# Patient Record
Sex: Female | Born: 1935 | Race: White | Hispanic: No | Smoking: Never smoker
Health system: Southern US, Community
[De-identification: ages and names within clinical notes are randomized; demographics above are authoritative.]

## PROBLEM LIST (undated history)

## (undated) DIAGNOSIS — M810 Age-related osteoporosis without current pathological fracture: Secondary | ICD-10-CM

## (undated) DIAGNOSIS — S065XAA Traumatic subdural hemorrhage with loss of consciousness status unknown, initial encounter: Secondary | ICD-10-CM

## (undated) DIAGNOSIS — I729 Aneurysm of unspecified site: Secondary | ICD-10-CM

## (undated) DIAGNOSIS — I509 Heart failure, unspecified: Secondary | ICD-10-CM

## (undated) DIAGNOSIS — S065X9A Traumatic subdural hemorrhage with loss of consciousness of unspecified duration, initial encounter: Secondary | ICD-10-CM

## (undated) DIAGNOSIS — I4891 Unspecified atrial fibrillation: Secondary | ICD-10-CM

## (undated) DIAGNOSIS — I10 Essential (primary) hypertension: Secondary | ICD-10-CM

## (undated) DIAGNOSIS — K5790 Diverticulosis of intestine, part unspecified, without perforation or abscess without bleeding: Secondary | ICD-10-CM

## (undated) HISTORY — DX: Traumatic subdural hemorrhage with loss of consciousness status unknown, initial encounter: S06.5XAA

## (undated) HISTORY — DX: Essential (primary) hypertension: I10

## (undated) HISTORY — DX: Aneurysm of unspecified site: I72.9

## (undated) HISTORY — DX: Unspecified atrial fibrillation: I48.91

## (undated) HISTORY — PX: BREAST BIOPSY: SHX20

## (undated) HISTORY — DX: Age-related osteoporosis without current pathological fracture: M81.0

## (undated) HISTORY — DX: Heart failure, unspecified: I50.9

## (undated) HISTORY — DX: Diverticulosis of intestine, part unspecified, without perforation or abscess without bleeding: K57.90

## (undated) HISTORY — DX: Traumatic subdural hemorrhage with loss of consciousness of unspecified duration, initial encounter: S06.5X9A

---
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
## 2004-02-20 ENCOUNTER — Other Ambulatory Visit: Payer: Self-pay

## 2005-02-09 ENCOUNTER — Ambulatory Visit: Payer: Self-pay | Admitting: Internal Medicine

## 2006-01-06 ENCOUNTER — Ambulatory Visit: Payer: Self-pay | Admitting: Cardiology

## 2006-01-06 ENCOUNTER — Inpatient Hospital Stay (HOSPITAL_COMMUNITY): Admission: EM | Admit: 2006-01-06 | Discharge: 2006-01-19 | Payer: Self-pay | Admitting: Emergency Medicine

## 2006-01-12 ENCOUNTER — Ambulatory Visit: Payer: Self-pay | Admitting: Emergency Medicine

## 2006-01-16 ENCOUNTER — Ambulatory Visit: Payer: Self-pay | Admitting: Physical Medicine & Rehabilitation

## 2006-02-14 ENCOUNTER — Encounter: Admission: RE | Admit: 2006-02-14 | Discharge: 2006-02-14 | Payer: Self-pay | Admitting: Neurosurgery

## 2006-03-31 ENCOUNTER — Ambulatory Visit: Payer: Self-pay | Admitting: Internal Medicine

## 2007-04-04 ENCOUNTER — Ambulatory Visit: Payer: Self-pay | Admitting: Internal Medicine

## 2008-01-15 ENCOUNTER — Ambulatory Visit: Payer: Self-pay | Admitting: Internal Medicine

## 2008-04-15 ENCOUNTER — Ambulatory Visit: Payer: Self-pay | Admitting: Internal Medicine

## 2009-09-18 ENCOUNTER — Ambulatory Visit: Payer: Self-pay | Admitting: Physician Assistant

## 2009-10-05 ENCOUNTER — Ambulatory Visit: Payer: Self-pay | Admitting: Internal Medicine

## 2009-10-23 ENCOUNTER — Ambulatory Visit: Payer: Self-pay | Admitting: Cardiology

## 2010-02-04 ENCOUNTER — Ambulatory Visit: Payer: Self-pay | Admitting: Internal Medicine

## 2011-12-06 ENCOUNTER — Inpatient Hospital Stay: Payer: Self-pay | Admitting: Internal Medicine

## 2011-12-06 DIAGNOSIS — I253 Aneurysm of heart: Secondary | ICD-10-CM | POA: Diagnosis not present

## 2011-12-06 DIAGNOSIS — I999 Unspecified disorder of circulatory system: Secondary | ICD-10-CM | POA: Diagnosis not present

## 2011-12-06 DIAGNOSIS — I771 Stricture of artery: Secondary | ICD-10-CM | POA: Diagnosis not present

## 2011-12-06 DIAGNOSIS — Z7982 Long term (current) use of aspirin: Secondary | ICD-10-CM | POA: Diagnosis not present

## 2011-12-06 DIAGNOSIS — R5381 Other malaise: Secondary | ICD-10-CM | POA: Diagnosis not present

## 2011-12-06 DIAGNOSIS — J90 Pleural effusion, not elsewhere classified: Secondary | ICD-10-CM | POA: Diagnosis not present

## 2011-12-06 DIAGNOSIS — R5383 Other fatigue: Secondary | ICD-10-CM | POA: Diagnosis not present

## 2011-12-06 DIAGNOSIS — I743 Embolism and thrombosis of arteries of the lower extremities: Secondary | ICD-10-CM | POA: Diagnosis not present

## 2011-12-06 DIAGNOSIS — K5730 Diverticulosis of large intestine without perforation or abscess without bleeding: Secondary | ICD-10-CM | POA: Diagnosis present

## 2011-12-06 DIAGNOSIS — M79609 Pain in unspecified limb: Secondary | ICD-10-CM | POA: Diagnosis not present

## 2011-12-06 DIAGNOSIS — Z800 Family history of malignant neoplasm of digestive organs: Secondary | ICD-10-CM | POA: Diagnosis not present

## 2011-12-06 DIAGNOSIS — Z79899 Other long term (current) drug therapy: Secondary | ICD-10-CM | POA: Diagnosis not present

## 2011-12-06 DIAGNOSIS — Z9889 Other specified postprocedural states: Secondary | ICD-10-CM | POA: Diagnosis not present

## 2011-12-06 DIAGNOSIS — I10 Essential (primary) hypertension: Secondary | ICD-10-CM | POA: Diagnosis not present

## 2011-12-06 DIAGNOSIS — S2249XA Multiple fractures of ribs, unspecified side, initial encounter for closed fracture: Secondary | ICD-10-CM | POA: Diagnosis not present

## 2011-12-06 DIAGNOSIS — I4891 Unspecified atrial fibrillation: Secondary | ICD-10-CM | POA: Diagnosis not present

## 2011-12-06 DIAGNOSIS — I498 Other specified cardiac arrhythmias: Secondary | ICD-10-CM | POA: Diagnosis not present

## 2011-12-06 DIAGNOSIS — M810 Age-related osteoporosis without current pathological fracture: Secondary | ICD-10-CM | POA: Diagnosis present

## 2011-12-06 DIAGNOSIS — I959 Hypotension, unspecified: Secondary | ICD-10-CM | POA: Diagnosis not present

## 2011-12-06 DIAGNOSIS — I509 Heart failure, unspecified: Secondary | ICD-10-CM | POA: Diagnosis not present

## 2011-12-06 LAB — COMPREHENSIVE METABOLIC PANEL
Anion Gap: 12 (ref 7–16)
BUN: 28 mg/dL — ABNORMAL HIGH (ref 7–18)
Calcium, Total: 8.8 mg/dL (ref 8.5–10.1)
Chloride: 92 mmol/L — ABNORMAL LOW (ref 98–107)
EGFR (African American): 53 — ABNORMAL LOW
Glucose: 201 mg/dL — ABNORMAL HIGH (ref 65–99)
SGOT(AST): 42 U/L — ABNORMAL HIGH (ref 15–37)
SGPT (ALT): 35 U/L
Sodium: 131 mmol/L — ABNORMAL LOW (ref 136–145)
Total Protein: 8.2 g/dL (ref 6.4–8.2)

## 2011-12-06 LAB — CBC
HGB: 14.1 g/dL (ref 12.0–16.0)
MCH: 30.9 pg (ref 26.0–34.0)
MCV: 95 fL (ref 80–100)
RBC: 4.55 10*6/uL (ref 3.80–5.20)
WBC: 7.7 10*3/uL (ref 3.6–11.0)

## 2011-12-06 LAB — PROTIME-INR: Prothrombin Time: 13.1 secs (ref 11.5–14.7)

## 2011-12-07 LAB — HEMOGLOBIN: HGB: 11.4 g/dL — ABNORMAL LOW (ref 12.0–16.0)

## 2011-12-07 LAB — APTT: Activated PTT: 76.2 secs — ABNORMAL HIGH (ref 23.6–35.9)

## 2011-12-08 LAB — APTT: Activated PTT: 87.9 secs — ABNORMAL HIGH (ref 23.6–35.9)

## 2011-12-23 DIAGNOSIS — I10 Essential (primary) hypertension: Secondary | ICD-10-CM | POA: Diagnosis not present

## 2011-12-23 DIAGNOSIS — Z7901 Long term (current) use of anticoagulants: Secondary | ICD-10-CM | POA: Diagnosis not present

## 2011-12-23 DIAGNOSIS — M79609 Pain in unspecified limb: Secondary | ICD-10-CM | POA: Diagnosis not present

## 2011-12-23 DIAGNOSIS — I4891 Unspecified atrial fibrillation: Secondary | ICD-10-CM | POA: Diagnosis not present

## 2011-12-30 DIAGNOSIS — I739 Peripheral vascular disease, unspecified: Secondary | ICD-10-CM | POA: Diagnosis not present

## 2011-12-30 DIAGNOSIS — I10 Essential (primary) hypertension: Secondary | ICD-10-CM | POA: Diagnosis not present

## 2011-12-30 DIAGNOSIS — I499 Cardiac arrhythmia, unspecified: Secondary | ICD-10-CM | POA: Diagnosis not present

## 2012-04-03 DIAGNOSIS — I10 Essential (primary) hypertension: Secondary | ICD-10-CM | POA: Diagnosis not present

## 2012-04-03 DIAGNOSIS — I499 Cardiac arrhythmia, unspecified: Secondary | ICD-10-CM | POA: Diagnosis not present

## 2012-04-03 DIAGNOSIS — J449 Chronic obstructive pulmonary disease, unspecified: Secondary | ICD-10-CM | POA: Diagnosis not present

## 2012-04-03 DIAGNOSIS — I743 Embolism and thrombosis of arteries of the lower extremities: Secondary | ICD-10-CM | POA: Diagnosis not present

## 2012-04-13 DIAGNOSIS — Z79899 Other long term (current) drug therapy: Secondary | ICD-10-CM | POA: Diagnosis not present

## 2012-04-20 DIAGNOSIS — I739 Peripheral vascular disease, unspecified: Secondary | ICD-10-CM | POA: Diagnosis not present

## 2012-04-20 DIAGNOSIS — I10 Essential (primary) hypertension: Secondary | ICD-10-CM | POA: Diagnosis not present

## 2012-04-20 DIAGNOSIS — I4891 Unspecified atrial fibrillation: Secondary | ICD-10-CM | POA: Diagnosis not present

## 2012-09-13 ENCOUNTER — Ambulatory Visit: Payer: Self-pay | Admitting: Internal Medicine

## 2012-09-26 ENCOUNTER — Encounter: Payer: Self-pay | Admitting: *Deleted

## 2012-09-27 ENCOUNTER — Encounter: Payer: Self-pay | Admitting: Internal Medicine

## 2012-09-27 ENCOUNTER — Ambulatory Visit (INDEPENDENT_AMBULATORY_CARE_PROVIDER_SITE_OTHER): Payer: Medicare Other | Admitting: Internal Medicine

## 2012-09-27 VITALS — BP 126/82 | HR 71 | Temp 98.1°F | Ht 60.0 in | Wt 100.0 lb

## 2012-09-27 DIAGNOSIS — I10 Essential (primary) hypertension: Secondary | ICD-10-CM

## 2012-09-27 DIAGNOSIS — E7800 Pure hypercholesterolemia, unspecified: Secondary | ICD-10-CM | POA: Diagnosis not present

## 2012-09-27 DIAGNOSIS — D649 Anemia, unspecified: Secondary | ICD-10-CM

## 2012-09-27 DIAGNOSIS — Z139 Encounter for screening, unspecified: Secondary | ICD-10-CM

## 2012-09-27 DIAGNOSIS — R5381 Other malaise: Secondary | ICD-10-CM

## 2012-09-27 DIAGNOSIS — Z23 Encounter for immunization: Secondary | ICD-10-CM | POA: Diagnosis not present

## 2012-09-27 DIAGNOSIS — R5383 Other fatigue: Secondary | ICD-10-CM | POA: Diagnosis not present

## 2012-09-27 DIAGNOSIS — I4891 Unspecified atrial fibrillation: Secondary | ICD-10-CM

## 2012-09-27 DIAGNOSIS — I999 Unspecified disorder of circulatory system: Secondary | ICD-10-CM | POA: Diagnosis not present

## 2012-09-27 DIAGNOSIS — I743 Embolism and thrombosis of arteries of the lower extremities: Secondary | ICD-10-CM | POA: Diagnosis not present

## 2012-09-27 DIAGNOSIS — M810 Age-related osteoporosis without current pathological fracture: Secondary | ICD-10-CM

## 2012-09-27 MED ORDER — RIVAROXABAN 15 MG PO TABS: 15.0000 mg | ORAL_TABLET | Freq: Every day | ORAL | Status: DC

## 2012-09-27 NOTE — Patient Instructions (Addendum)
It was good to see you again.  I am glad you have been doing well.  We will schedule your mammogram and labs.

## 2012-09-28 DIAGNOSIS — Z23 Encounter for immunization: Secondary | ICD-10-CM | POA: Diagnosis not present

## 2012-09-30 ENCOUNTER — Encounter: Payer: Self-pay | Admitting: Internal Medicine

## 2012-09-30 DIAGNOSIS — I10 Essential (primary) hypertension: Secondary | ICD-10-CM | POA: Insufficient documentation

## 2012-09-30 DIAGNOSIS — M810 Age-related osteoporosis without current pathological fracture: Secondary | ICD-10-CM | POA: Insufficient documentation

## 2012-09-30 DIAGNOSIS — D649 Anemia, unspecified: Secondary | ICD-10-CM | POA: Insufficient documentation

## 2012-09-30 DIAGNOSIS — I743 Embolism and thrombosis of arteries of the lower extremities: Secondary | ICD-10-CM | POA: Insufficient documentation

## 2012-09-30 DIAGNOSIS — I4891 Unspecified atrial fibrillation: Secondary | ICD-10-CM | POA: Insufficient documentation

## 2012-09-30 NOTE — Assessment & Plan Note (Signed)
Receiving B12 injections.  Follow cbc.  

## 2012-09-30 NOTE — Progress Notes (Signed)
  Subjective:    Patient ID: Carol Vega, female    DOB: 1921-05-23, 78 y.o.   MRN: [id number]  HPI 78 year old female with past history of hypertension, atrial fibrillation and arterial embolus (involving her lower extremity).  She comes in today accompanied by her daughter to follow up on these issues as well as to establish care.  Has been doing well.  No increased heart rate or palpitations.  No sob.  Eating and drinking well.  Has gained some weight.  Was admitted 1/13 for an arterial embolus to her lower extremity.  Saw Dr Wyn Quaker in follow up.  Is doing well now.  He is following her on a yearly basis.  No bowel problems.    Past Medical History  Diagnosis Date  . Osteoporosis   . Diverticulosis   . CHF (congestive heart failure)     h/o pleural effusions  . Atrial fibrillation   . Hypertension   . Aneurysm     inter atrial septum  . Subdural hematoma     admitted 2007    Review of Systems Patient denies any headache, lightheadedness or dizziness. No sinus or allergy symptoms.   No chest pain, tightness or palpitations.  No increased shortness of breath, cough or congestion.  No nausea or vomiting.  No abdominal pain or cramping.  No bowel change, such as diarrhea, constipation, BRBPR or melana.  No urine change.  Overall she feels she is doing well.      Objective:   Physical Exam Filed Vitals:   09/27/12 1559  BP: 126/82  Pulse: 71  Temp: 98.1 F (1.37 C)   78 year old female in no acute distress.   HEENT:  Nares - clear.  OP- without lesions or erythema.  NECK:  Supple, nontender.  No audibl bruit.   HEART:  Rate controlled.  LUNGS:  Without crackles or wheezing audible.  Respirations even and unlabored.   RADIAL PULSE:  Equal bilaterally.  ABDOMEN:  Soft, nontender.  No audible abdominal bruit.   EXTREMITIES:  No increased edema to be present.                     Assessment & Plan:  HEALTH MAINTENANCE.  Will need to obtain outside records to review.  Schedule  mammogram.  Flu shot given today.

## 2012-09-30 NOTE — Assessment & Plan Note (Signed)
Blood pressure controlled.  On Losartan.  Follow.  Check met b with next labs.     

## 2012-09-30 NOTE — Assessment & Plan Note (Signed)
On Xarelto.  Seeing Dr Dew.  Follow.       

## 2012-09-30 NOTE — Assessment & Plan Note (Signed)
On calcium with vitamin D.     

## 2012-09-30 NOTE — Assessment & Plan Note (Signed)
Heart rate controlled.  On Diltiazem.  Follow.

## 2012-10-05 ENCOUNTER — Other Ambulatory Visit (INDEPENDENT_AMBULATORY_CARE_PROVIDER_SITE_OTHER): Payer: Medicare Other

## 2012-10-05 DIAGNOSIS — E7800 Pure hypercholesterolemia, unspecified: Secondary | ICD-10-CM

## 2012-10-05 DIAGNOSIS — R5383 Other fatigue: Secondary | ICD-10-CM

## 2012-10-05 DIAGNOSIS — R5381 Other malaise: Secondary | ICD-10-CM | POA: Diagnosis not present

## 2012-10-05 LAB — COMPREHENSIVE METABOLIC PANEL
ALT: 14 U/L (ref 0–35)
AST: 20 U/L (ref 0–37)
Alkaline Phosphatase: 53 U/L (ref 39–117)
BUN: 21 mg/dL (ref 6–23)
CO2: 26 mEq/L (ref 19–32)
Calcium: 8.8 mg/dL (ref 8.4–10.5)
Chloride: 104 mEq/L (ref 96–112)
Creatinine, Ser: 1.2 mg/dL (ref 0.4–1.2)
Glucose, Bld: 110 mg/dL — ABNORMAL HIGH (ref 70–99)
Sodium: 135 mEq/L (ref 135–145)
Total Bilirubin: 0.5 mg/dL (ref 0.3–1.2)

## 2012-10-05 LAB — CBC WITH DIFFERENTIAL/PLATELET
Eosinophils Absolute: 0.3 10*3/uL (ref 0.0–0.7)
HCT: 35.5 % — ABNORMAL LOW (ref 36.0–46.0)
Monocytes Relative: 9.7 % (ref 3.0–12.0)
Platelets: 329 10*3/uL (ref 150.0–400.0)
WBC: 8.6 10*3/uL (ref 4.5–10.5)

## 2012-10-05 LAB — LIPID PANEL
Cholesterol: 155 mg/dL (ref 0–200)
HDL: 52.5 mg/dL (ref >39.00)
Total CHOL/HDL Ratio: 3
VLDL: 11.6 mg/dL (ref 0.0–40.0)

## 2012-10-05 LAB — TSH: TSH: 2.61 u[IU]/mL (ref 0.35–5.50)

## 2012-10-09 ENCOUNTER — Other Ambulatory Visit: Payer: Self-pay | Admitting: Internal Medicine

## 2012-10-09 DIAGNOSIS — R739 Hyperglycemia, unspecified: Secondary | ICD-10-CM

## 2012-10-09 DIAGNOSIS — D649 Anemia, unspecified: Secondary | ICD-10-CM

## 2012-10-09 DIAGNOSIS — N289 Disorder of kidney and ureter, unspecified: Secondary | ICD-10-CM

## 2012-10-10 ENCOUNTER — Ambulatory Visit: Payer: Self-pay | Admitting: Internal Medicine

## 2012-10-10 DIAGNOSIS — Z1231 Encounter for screening mammogram for malignant neoplasm of breast: Secondary | ICD-10-CM | POA: Diagnosis not present

## 2012-10-10 DIAGNOSIS — N6489 Other specified disorders of breast: Secondary | ICD-10-CM | POA: Diagnosis not present

## 2012-10-13 ENCOUNTER — Telehealth: Payer: Self-pay | Admitting: Internal Medicine

## 2012-10-13 NOTE — Telephone Encounter (Signed)
Notify pt mammo ok.  

## 2012-10-22 ENCOUNTER — Encounter: Payer: Self-pay | Admitting: Internal Medicine

## 2012-10-26 ENCOUNTER — Other Ambulatory Visit (INDEPENDENT_AMBULATORY_CARE_PROVIDER_SITE_OTHER): Payer: Medicare Other

## 2012-10-26 DIAGNOSIS — D649 Anemia, unspecified: Secondary | ICD-10-CM

## 2012-10-26 DIAGNOSIS — R7309 Other abnormal glucose: Secondary | ICD-10-CM | POA: Diagnosis not present

## 2012-10-26 DIAGNOSIS — N289 Disorder of kidney and ureter, unspecified: Secondary | ICD-10-CM

## 2012-10-26 DIAGNOSIS — R739 Hyperglycemia, unspecified: Secondary | ICD-10-CM

## 2012-10-26 LAB — CBC WITH DIFFERENTIAL/PLATELET
Basophils Relative: 0.6 % (ref 0.0–3.0)
Eosinophils Absolute: 0.3 10*3/uL (ref 0.0–0.7)
Eosinophils Relative: 3.8 % (ref 0.0–5.0)
Hemoglobin: 12.3 g/dL (ref 12.0–15.0)
Lymphocytes Relative: 23.6 % (ref 12.0–46.0)
Lymphs Abs: 1.7 10*3/uL (ref 0.7–4.0)
MCHC: 33.1 g/dL (ref 30.0–36.0)
Monocytes Absolute: 0.8 10*3/uL (ref 0.1–1.0)
Neutro Abs: 4.3 10*3/uL (ref 1.4–7.7)
Neutrophils Relative %: 60.1 % (ref 43.0–77.0)
RBC: 3.91 Mil/uL (ref 3.87–5.11)

## 2012-10-26 LAB — BASIC METABOLIC PANEL
BUN: 19 mg/dL (ref 6–23)
CO2: 27 mEq/L (ref 19–32)
Chloride: 99 mEq/L (ref 96–112)
Creatinine, Ser: 1.2 mg/dL (ref 0.4–1.2)
Glucose, Bld: 110 mg/dL — ABNORMAL HIGH (ref 70–99)
Potassium: 4.2 mEq/L (ref 3.5–5.1)

## 2012-10-26 LAB — IRON AND TIBC: %SAT: 29 % (ref 20–55)

## 2012-10-26 LAB — IBC PANEL: Iron: 93 ug/dL (ref 42–145)

## 2012-10-26 LAB — HEMOGLOBIN A1C: Hgb A1c MFr Bld: 5.7 % (ref 4.6–6.5)

## 2012-10-27 ENCOUNTER — Other Ambulatory Visit: Payer: Self-pay | Admitting: Internal Medicine

## 2012-10-27 DIAGNOSIS — E871 Hypo-osmolality and hyponatremia: Secondary | ICD-10-CM

## 2012-10-27 NOTE — Progress Notes (Signed)
Pt to be notified of labs via messaging.   Order placed for follow up sodium.

## 2012-11-01 ENCOUNTER — Telehealth: Payer: Self-pay | Admitting: *Deleted

## 2012-11-01 NOTE — Telephone Encounter (Signed)
Message copied by Marlene Lard on Thu Nov 01, 2012  5:45 PM ------      Message from: Charm Barges      Created: Sat Oct 27, 2012  5:43 PM       Notify pts daughter that her hemoglobin and iron studies - ok.  Hemoglobin is normal.  Overall sugar control (a1c) is normal.  Kidney function stable.  Sodium just slightly decreased - will follow.  I want to recheck a sodium level in 3-4 weeks - just to confirm remaining stable.  I will place order for labs - just need to enter appt time.  (does not have to be fasting).

## 2012-11-02 NOTE — Telephone Encounter (Signed)
Called patient's daughter and let her have results. Made appointment.

## 2012-11-30 ENCOUNTER — Other Ambulatory Visit: Payer: Medicare Other

## 2012-11-30 ENCOUNTER — Other Ambulatory Visit (INDEPENDENT_AMBULATORY_CARE_PROVIDER_SITE_OTHER): Payer: Medicare Other

## 2012-11-30 DIAGNOSIS — E871 Hypo-osmolality and hyponatremia: Secondary | ICD-10-CM

## 2012-11-30 LAB — SODIUM: Sodium: 133 mEq/L — ABNORMAL LOW (ref 135–145)

## 2012-12-02 ENCOUNTER — Telehealth: Payer: Self-pay | Admitting: Internal Medicine

## 2012-12-02 DIAGNOSIS — E871 Hypo-osmolality and hyponatremia: Secondary | ICD-10-CM

## 2012-12-02 NOTE — Telephone Encounter (Signed)
pts daughter was notified of lab results and need for a follow up non fasting lab in 4 weeks.  She wanted to call back Monday 12/03/12 to schedule appt.  Please schedule a non fasting lab appt in 4 weeks - when daughter nancy calls back.  i have already placed the order for the lab.  Thanks

## 2012-12-10 NOTE — Telephone Encounter (Signed)
Appointment 2/14 @ 10.  Pt daughter nancy aware of appointment

## 2012-12-25 ENCOUNTER — Other Ambulatory Visit: Payer: Self-pay | Admitting: *Deleted

## 2012-12-25 MED ORDER — LOSARTAN POTASSIUM 50 MG PO TABS: 50.0000 mg | ORAL_TABLET | Freq: Every day | ORAL | Status: DC

## 2013-01-04 ENCOUNTER — Other Ambulatory Visit: Payer: Medicare Other

## 2013-01-11 ENCOUNTER — Other Ambulatory Visit: Payer: Medicare Other

## 2013-01-16 ENCOUNTER — Other Ambulatory Visit (INDEPENDENT_AMBULATORY_CARE_PROVIDER_SITE_OTHER): Payer: Medicare Other

## 2013-01-16 DIAGNOSIS — E871 Hypo-osmolality and hyponatremia: Secondary | ICD-10-CM

## 2013-01-25 ENCOUNTER — Encounter: Payer: Medicare Other | Admitting: Internal Medicine

## 2013-02-06 ENCOUNTER — Telehealth: Payer: Self-pay | Admitting: Internal Medicine

## 2013-02-06 MED ORDER — METOPROLOL SUCCINATE ER 25 MG PO TB24: 25.0000 mg | ORAL_TABLET | Freq: Every day | ORAL | Status: DC

## 2013-02-06 NOTE — Telephone Encounter (Signed)
Refilled metoprolol 

## 2013-02-07 ENCOUNTER — Ambulatory Visit (INDEPENDENT_AMBULATORY_CARE_PROVIDER_SITE_OTHER): Payer: Medicare Other | Admitting: Internal Medicine

## 2013-02-07 ENCOUNTER — Encounter: Payer: Self-pay | Admitting: Internal Medicine

## 2013-02-07 VITALS — BP 108/64 | HR 63 | Temp 97.8°F | Ht 60.0 in | Wt 105.5 lb

## 2013-02-07 DIAGNOSIS — I4891 Unspecified atrial fibrillation: Secondary | ICD-10-CM | POA: Diagnosis not present

## 2013-02-07 DIAGNOSIS — Z0000 Encounter for general adult medical examination without abnormal findings: Secondary | ICD-10-CM | POA: Diagnosis not present

## 2013-02-07 DIAGNOSIS — D649 Anemia, unspecified: Secondary | ICD-10-CM

## 2013-02-07 DIAGNOSIS — I743 Embolism and thrombosis of arteries of the lower extremities: Secondary | ICD-10-CM | POA: Diagnosis not present

## 2013-02-07 DIAGNOSIS — I10 Essential (primary) hypertension: Secondary | ICD-10-CM

## 2013-02-07 DIAGNOSIS — L989 Disorder of the skin and subcutaneous tissue, unspecified: Secondary | ICD-10-CM

## 2013-02-07 DIAGNOSIS — E871 Hypo-osmolality and hyponatremia: Secondary | ICD-10-CM

## 2013-02-07 DIAGNOSIS — L600 Ingrowing nail: Secondary | ICD-10-CM

## 2013-02-07 DIAGNOSIS — M810 Age-related osteoporosis without current pathological fracture: Secondary | ICD-10-CM

## 2013-02-07 LAB — BASIC METABOLIC PANEL
BUN: 18 mg/dL (ref 6–23)
CO2: 24 mEq/L (ref 19–32)
Chloride: 99 mEq/L (ref 96–112)
Glucose, Bld: 107 mg/dL — ABNORMAL HIGH (ref 70–99)
Potassium: 4 mEq/L (ref 3.5–5.1)
Sodium: 132 mEq/L — ABNORMAL LOW (ref 135–145)

## 2013-02-08 ENCOUNTER — Encounter: Payer: Self-pay | Admitting: Internal Medicine

## 2013-02-08 NOTE — Progress Notes (Signed)
Subjective:    Patient ID: Carol Vega, female    DOB: 01-19-1921, 79 y.o.   MRN: [id number]  HPI 79 year old female with past history of hypertension, atrial fibrillation and arterial embolus (involving her lower extremity).  She comes in today accompanied by her daughter to follow up on these issues as well as for a complete physical exam.  Has been doing well.  She has noticed some occasional fluttering  - in the evening.  No chest pain or tightness.   No sob.  Eating and drinking better.  Weight doing better.  Was admitted 1/13 for an arterial embolus to her lower extremity.  Saw Dr Wyn Quaker in follow up.  Is doing well now.  He is following her on a yearly basis.  No bowel problems.  I had a long discussion with the patient today regarding the need for her to continue to live with her daughter.     Past Medical History  Diagnosis Date  . Osteoporosis   . Diverticulosis   . CHF (congestive heart failure)     h/o pleural effusions  . Atrial fibrillation   . Hypertension   . Aneurysm     inter atrial septum  . Subdural hematoma     admitted 2007    Current Outpatient Prescriptions on File Prior to Visit  Medication Sig Dispense Refill  . diltiazem (DILACOR XR) 120 MG 24 hr capsule Take 120 mg by mouth daily.      Marland Kitchen losartan (COZAAR) 50 MG tablet Take 1 tablet (50 mg total) by mouth daily.  30 tablet  5  . metoprolol succinate (TOPROL-XL) 25 MG 24 hr tablet Take 1 tablet (25 mg total) by mouth daily.  30 tablet  5  . Rivaroxaban (XARELTO) 15 MG TABS tablet Take 1 tablet (15 mg total) by mouth daily.  30 tablet  4  . calcium carbonate (OS-CAL) 600 MG TABS Take 600 mg by mouth 2 (two) times daily with a meal.       No current facility-administered medications on file prior to visit.    Review of Systems Patient denies any headache, lightheadedness or dizziness. No sinus or allergy symptoms.   No chest pain or tightness.  Does report some occasional palpitations/fluttering - noticed more in  the evening.   No increased shortness of breath, cough or congestion.  No nausea or vomiting.  No abdominal pain or cramping.  No bowel change, such as diarrhea, constipation, BRBPR or melana.  No urine change.  Overall she feels she is doing well.  Wants to go back home.  We discussed this at length today.  Discussed the need for her to continue to live with her daughter.  Discussed risk and my concerns of her living by herself.       Objective:   Physical Exam  Filed Vitals:   02/07/13 0932  BP: 108/64  Pulse: 63  Temp: 97.8 F (25.37 C)   79 year old female in no acute distress.   HEENT:  Nares- clear.  Oropharynx - without lesions. NECK:  Supple.  Nontender.  No audible bruit.  HEART:  Appears to be irregular.  Rate controlled.   LUNGS:  No crackles or wheezing audible.  Respirations even and unlabored.  RADIAL PULSE:  Equal bilaterally.    BREASTS:  No nipple discharge or nipple retraction present.  Could not appreciate any distinct nodules or axillary adenopathy.  ABDOMEN:  Soft, nontender.  Bowel sounds present and normal.  No  audible abdominal bruit.  GU:  Normal external genitalia.  Vaginal vault without lesions.  Pap not performed.  Could not appreciate any adnexal masses or tenderness.   RECTAL:  Heme negative.   EXTREMITIES:  No increased edema present.  DP pulses palpable and equal bilaterally.      SKIN:  Facial lesion.         Assessment & Plan:  DISPOSITION.  Discussed at length with her today regarding the need for her to continue to live with her daughter and my concerns with her returning home to live by herself.    PODIATRY.  Request referral to have her toe nails trimmed.  Referral made.   DERMATOLOGY.  Has a left facial lesion - persistent.  Daughter states that Carol Vega has a history of skin cancer and wants her set up to have a skin survey.  Referral made.  Request to see Dr Orson Aloe.    HEALTH MAINTENANCE.  Physical today.  Schedule mammogram.

## 2013-02-10 ENCOUNTER — Encounter: Payer: Self-pay | Admitting: Internal Medicine

## 2013-02-10 DIAGNOSIS — E871 Hypo-osmolality and hyponatremia: Secondary | ICD-10-CM | POA: Insufficient documentation

## 2013-02-10 NOTE — Assessment & Plan Note (Signed)
On calcium with vitamin D.

## 2013-02-10 NOTE — Assessment & Plan Note (Signed)
Receiving B12 injections.  Follow cbc.  Hgb 10/26/12 - 12.3.

## 2013-02-10 NOTE — Assessment & Plan Note (Signed)
Blood pressure controlled.  On Losartan.  Follow.  Check met b with next labs.

## 2013-02-10 NOTE — Assessment & Plan Note (Signed)
Heart rate controlled.  On Diltiazem.  Occasional fluttering.  Appears overall to be fairly stable.  Daughter request appt with Dr Lady Gary for further evaluation.  Does have the occasional fluttering noticed more in the evening.  Will refer for cardiac evaluation.

## 2013-02-10 NOTE — Assessment & Plan Note (Signed)
On Xarelto.  Seeing Dr Wyn Quaker.  Follow.

## 2013-02-10 NOTE — Assessment & Plan Note (Signed)
Recheck sodium to confirm stable.  

## 2013-02-14 DIAGNOSIS — I4891 Unspecified atrial fibrillation: Secondary | ICD-10-CM | POA: Diagnosis not present

## 2013-02-14 DIAGNOSIS — I10 Essential (primary) hypertension: Secondary | ICD-10-CM | POA: Diagnosis not present

## 2013-02-14 DIAGNOSIS — R002 Palpitations: Secondary | ICD-10-CM | POA: Diagnosis not present

## 2013-02-15 DIAGNOSIS — B351 Tinea unguium: Secondary | ICD-10-CM | POA: Diagnosis not present

## 2013-02-15 DIAGNOSIS — M2040 Other hammer toe(s) (acquired), unspecified foot: Secondary | ICD-10-CM | POA: Diagnosis not present

## 2013-02-27 DIAGNOSIS — L570 Actinic keratosis: Secondary | ICD-10-CM | POA: Diagnosis not present

## 2013-02-27 DIAGNOSIS — L821 Other seborrheic keratosis: Secondary | ICD-10-CM | POA: Diagnosis not present

## 2013-02-27 DIAGNOSIS — C44320 Squamous cell carcinoma of skin of unspecified parts of face: Secondary | ICD-10-CM | POA: Diagnosis not present

## 2013-02-27 DIAGNOSIS — Q809 Congenital ichthyosis, unspecified: Secondary | ICD-10-CM | POA: Diagnosis not present

## 2013-02-27 DIAGNOSIS — Z0189 Encounter for other specified special examinations: Secondary | ICD-10-CM | POA: Diagnosis not present

## 2013-03-15 DIAGNOSIS — R0602 Shortness of breath: Secondary | ICD-10-CM | POA: Diagnosis not present

## 2013-03-15 DIAGNOSIS — I4891 Unspecified atrial fibrillation: Secondary | ICD-10-CM | POA: Diagnosis not present

## 2013-03-19 ENCOUNTER — Other Ambulatory Visit: Payer: Self-pay | Admitting: *Deleted

## 2013-03-19 MED ORDER — DILTIAZEM HCL ER 120 MG PO CP24: 120.0000 mg | ORAL_CAPSULE | Freq: Every day | ORAL | Status: DC

## 2013-03-20 DIAGNOSIS — L570 Actinic keratosis: Secondary | ICD-10-CM | POA: Diagnosis not present

## 2013-03-20 DIAGNOSIS — C44320 Squamous cell carcinoma of skin of unspecified parts of face: Secondary | ICD-10-CM | POA: Diagnosis not present

## 2013-04-03 ENCOUNTER — Other Ambulatory Visit: Payer: Self-pay | Admitting: *Deleted

## 2013-04-04 MED ORDER — RIVAROXABAN 15 MG PO TABS: 15.0000 mg | ORAL_TABLET | Freq: Every day | ORAL | Status: DC

## 2013-04-16 DIAGNOSIS — I743 Embolism and thrombosis of arteries of the lower extremities: Secondary | ICD-10-CM | POA: Diagnosis not present

## 2013-04-16 DIAGNOSIS — I499 Cardiac arrhythmia, unspecified: Secondary | ICD-10-CM | POA: Diagnosis not present

## 2013-04-16 DIAGNOSIS — I10 Essential (primary) hypertension: Secondary | ICD-10-CM | POA: Diagnosis not present

## 2013-04-16 DIAGNOSIS — M79609 Pain in unspecified limb: Secondary | ICD-10-CM | POA: Diagnosis not present

## 2013-05-10 DIAGNOSIS — I4891 Unspecified atrial fibrillation: Secondary | ICD-10-CM | POA: Diagnosis not present

## 2013-05-10 DIAGNOSIS — I10 Essential (primary) hypertension: Secondary | ICD-10-CM | POA: Diagnosis not present

## 2013-05-13 DIAGNOSIS — L905 Scar conditions and fibrosis of skin: Secondary | ICD-10-CM | POA: Diagnosis not present

## 2013-05-13 DIAGNOSIS — L821 Other seborrheic keratosis: Secondary | ICD-10-CM | POA: Diagnosis not present

## 2013-05-13 DIAGNOSIS — D692 Other nonthrombocytopenic purpura: Secondary | ICD-10-CM | POA: Diagnosis not present

## 2013-05-13 DIAGNOSIS — C44320 Squamous cell carcinoma of skin of unspecified parts of face: Secondary | ICD-10-CM | POA: Diagnosis not present

## 2013-05-16 ENCOUNTER — Ambulatory Visit (INDEPENDENT_AMBULATORY_CARE_PROVIDER_SITE_OTHER): Payer: Medicare Other | Admitting: Internal Medicine

## 2013-05-16 ENCOUNTER — Encounter: Payer: Self-pay | Admitting: Internal Medicine

## 2013-05-16 VITALS — BP 120/90 | HR 73 | Temp 98.1°F | Ht 60.0 in | Wt 104.0 lb

## 2013-05-16 DIAGNOSIS — E871 Hypo-osmolality and hyponatremia: Secondary | ICD-10-CM

## 2013-05-16 DIAGNOSIS — M810 Age-related osteoporosis without current pathological fracture: Secondary | ICD-10-CM | POA: Diagnosis not present

## 2013-05-16 DIAGNOSIS — D649 Anemia, unspecified: Secondary | ICD-10-CM

## 2013-05-16 DIAGNOSIS — I4891 Unspecified atrial fibrillation: Secondary | ICD-10-CM

## 2013-05-16 DIAGNOSIS — E878 Other disorders of electrolyte and fluid balance, not elsewhere classified: Secondary | ICD-10-CM

## 2013-05-16 DIAGNOSIS — I743 Embolism and thrombosis of arteries of the lower extremities: Secondary | ICD-10-CM | POA: Diagnosis not present

## 2013-05-16 DIAGNOSIS — I10 Essential (primary) hypertension: Secondary | ICD-10-CM | POA: Diagnosis not present

## 2013-05-17 DIAGNOSIS — M79609 Pain in unspecified limb: Secondary | ICD-10-CM | POA: Diagnosis not present

## 2013-05-17 DIAGNOSIS — B351 Tinea unguium: Secondary | ICD-10-CM | POA: Diagnosis not present

## 2013-05-17 LAB — BASIC METABOLIC PANEL
BUN: 19 mg/dL (ref 6–23)
CO2: 25 mEq/L (ref 19–32)
Chloride: 100 mEq/L (ref 96–112)
Creatinine, Ser: 1.1 mg/dL (ref 0.4–1.2)

## 2013-05-18 ENCOUNTER — Encounter: Payer: Self-pay | Admitting: Internal Medicine

## 2013-05-18 NOTE — Assessment & Plan Note (Signed)
Blood pressure controlled.  On Losartan.  Follow.  Check met b.   

## 2013-05-18 NOTE — Assessment & Plan Note (Signed)
Receiving B12 injections.  Follow cbc.  Hgb 10/26/12 - 12.3.

## 2013-05-18 NOTE — Assessment & Plan Note (Signed)
Heart rate controlled.  On Diltiazem.  Appears overall to be fairly stable.  Just saw Dr Lady Gary.  States everything checked out fine.  Follow.

## 2013-05-18 NOTE — Assessment & Plan Note (Signed)
Recheck sodium to confirm stable.  

## 2013-05-18 NOTE — Progress Notes (Signed)
Subjective:    Patient ID: Carol Vega, female    DOB: 1921-02-10, 79 y.o.   MRN: [id number]  HPI 79 year old female with past history of hypertension, atrial fibrillation and arterial embolus (involving her lower extremity).  She comes in today accompanied by her daughter - for a scheduled follow up. Has been doing well.  No chest pain or tightness.  No increased palpitations or fluttering.  No sob.  Recently saw Dr Lady Gary.  Felt everything from a cardiac standpoint - ok.   Eating and drinking better.  Weight relatively stable.  Was admitted 1/13 for an arterial embolus to her lower extremity.  Saw Dr Wyn Quaker in follow up.  Is doing well now.  He is following her on a yearly basis.  No bowel problems.  Discussed with pt today regarding increased stress, etc.  She denies any problems with depression or stress.  Overall she feels she is doing well.    Past Medical History  Diagnosis Date  . Osteoporosis   . Diverticulosis   . CHF (congestive heart failure)     h/o pleural effusions  . Atrial fibrillation   . Hypertension   . Aneurysm     inter atrial septum  . Subdural hematoma     admitted 2007    Current Outpatient Prescriptions on File Prior to Visit  Medication Sig Dispense Refill  . calcium carbonate (OS-CAL) 600 MG TABS Take 600 mg by mouth 2 (two) times daily with a meal.      . diltiazem (DILACOR XR) 120 MG 24 hr capsule Take 1 capsule (120 mg total) by mouth daily.  30 capsule  5  . losartan (COZAAR) 50 MG tablet Take 1 tablet (50 mg total) by mouth daily.  30 tablet  5  . metoprolol succinate (TOPROL-XL) 25 MG 24 hr tablet Take 1 tablet (25 mg total) by mouth daily.  30 tablet  5  . Rivaroxaban (XARELTO) 15 MG TABS tablet Take 1 tablet (15 mg total) by mouth daily.  30 tablet  1   No current facility-administered medications on file prior to visit.    Review of Systems Patient denies any headache, lightheadedness or dizziness. No sinus or allergy symptoms.   No chest pain or  tightness. No increased heart rate or palpitations.  No increased shortness of breath, cough or congestion.  No nausea or vomiting.  No acid reflux.   No abdominal pain or cramping.  No bowel change, such as diarrhea, constipation, BRBPR or melana.  No urine change.  Overall she feels she is doing well.  Denies any increased stress or depression.      Objective:   Physical Exam  Filed Vitals:   05/16/13 1529  BP: 120/90  Pulse: 73  Temp: 98.1 F (36.7 C)   Blood pressure recheck:  140/84-86, pulse 77  79 year old female in no acute distress.   HEENT:  Nares- clear.  Oropharynx - without lesions. NECK:  Supple.  Nontender.  No audible bruit.  HEART:  Appears to be irregular.  Rate controlled.   LUNGS:  No crackles or wheezing audible.  Respirations even and unlabored.  RADIAL PULSE:  Equal bilaterally.  ABDOMEN:  Soft, nontender.  Bowel sounds present and normal.  No audible abdominal bruit.    EXTREMITIES:  No increased edema present.  DP pulses palpable and equal bilaterally.        Assessment & Plan:  DISPOSITION.  Discussed at length with her last visit regarding the  need for her to continue to live with her daughter and my concerns with her returning home to live by herself.  She is continuing to live with her daughter.    PODIATRY.  Request referral to have her toe nails trimmed.  Referral made.  Plans to see podiatry this week.   DERMATOLOGY.  Seeing Dr Orson Aloe.    HEALTH MAINTENANCE.  Physical last visit.  Mammogram 10/10/12 - Birads II.     I spent 25 minutes with the pt and her daughter and more than 50% of the time was spent in consultation regarding the above.

## 2013-05-18 NOTE — Assessment & Plan Note (Signed)
On Xarelto.  Seeing Dr Wyn Quaker.  Follow.

## 2013-05-18 NOTE — Assessment & Plan Note (Signed)
On calcium with vitamin D.

## 2013-08-06 ENCOUNTER — Other Ambulatory Visit: Payer: Self-pay | Admitting: Internal Medicine

## 2013-08-07 ENCOUNTER — Encounter: Payer: Self-pay | Admitting: Adult Health

## 2013-08-07 ENCOUNTER — Ambulatory Visit (INDEPENDENT_AMBULATORY_CARE_PROVIDER_SITE_OTHER): Payer: Medicare Other | Admitting: Adult Health

## 2013-08-07 VITALS — BP 130/70 | HR 69 | Temp 97.5°F | Resp 12 | Wt 103.5 lb

## 2013-08-07 DIAGNOSIS — S40029A Contusion of unspecified upper arm, initial encounter: Secondary | ICD-10-CM

## 2013-08-07 DIAGNOSIS — W19XXXA Unspecified fall, initial encounter: Secondary | ICD-10-CM | POA: Diagnosis not present

## 2013-08-07 DIAGNOSIS — S40022A Contusion of left upper arm, initial encounter: Secondary | ICD-10-CM

## 2013-08-07 NOTE — Assessment & Plan Note (Signed)
Stat cbc. Hold xarelto today. Area marked with ink to determine if hematoma progresses. RTC tomorrow for f/u

## 2013-08-07 NOTE — Progress Notes (Signed)
  Subjective:    Patient ID: Carol Vega, female    DOB: 12/16/1920, 79 y.o.   MRN: [id number]  HPI  Patient is a very pleasant 79 year old female with hx of afib, arterial embolus and thrombosis of lower extremity (2013) and started on xarelto by Dr. Wyn Quaker who presents to clinic status post fall on Friday. She did not fall against any objects. She fell on a carpeted floor on her left arm. Her daughter observed her arm closely over the weekend with no signs of bruising, swelling or erythema. Pt presents to clinic today with bruising of her left arm and hematoma. Daughter is with her during the visit and she reports this bruising and discoloration began yesterday. Patient denies any other symptoms. She is able to move her arm without pain or discomfort. The only reason why she came in to be seen was because of the large bruising on her arm. Patient reports that she is feeling very well.   Current Outpatient Prescriptions on File Prior to Visit  Medication Sig Dispense Refill  . calcium carbonate (OS-CAL) 600 MG TABS Take 600 mg by mouth 2 (two) times daily with a meal.      . diltiazem (DILACOR XR) 120 MG 24 hr capsule Take 1 capsule (120 mg total) by mouth daily.  30 capsule  5  . losartan (COZAAR) 50 MG tablet Take 1 tablet (50 mg total) by mouth daily.  30 tablet  5  . metoprolol succinate (TOPROL-XL) 25 MG 24 hr tablet TAKE ONE (1) TABLET BY MOUTH EVERY DAY  30 tablet  5  . Rivaroxaban (XARELTO) 15 MG TABS tablet Take 1 tablet (15 mg total) by mouth daily.  30 tablet  1   No current facility-administered medications on file prior to visit.    Review of Systems  Constitutional: Negative.   Respiratory: Negative.   Cardiovascular: Negative.   Skin:       Large bruise on left arm.  Neurological: Negative.   Psychiatric/Behavioral: Negative.   All other systems reviewed and are negative.    BP 130/70  Pulse 69  Temp(Src) 97.5 F (36.4 C) (Oral)  Resp 12  Wt 103 lb 8 oz (46.947 kg)   BMI 20.21 kg/m2  SpO2 99%    Objective:   Physical Exam  Constitutional:  Pleasant female in NAD.  Cardiovascular: Normal rate, regular rhythm and intact distal pulses.   Pulmonary/Chest: Effort normal. No respiratory distress.  Neurological: She is alert.  Skin:  Large ecchymosis left arm.  Psychiatric: She has a normal mood and affect. Her behavior is normal. Judgment and thought content normal.      Assessment & Plan:

## 2013-08-07 NOTE — Assessment & Plan Note (Signed)
Fall on Friday with development of large hematoma of the left arm first observed by pt/daughter yesterday. Concerning since patient is on xarelto. Hold medication today and re-evaluate area tomorrow. Patient able to move arm with excellent ROM. There is no pain or tenderness in her arm. Excellent pulses. No broken skin observed. RTC tomorrow for f/u.

## 2013-08-07 NOTE — Patient Instructions (Addendum)
  Please have labs drawn prior to leaving the office.  Hold Xarelto tonight.  Return for follow up tomorrow.

## 2013-08-08 ENCOUNTER — Encounter: Payer: Self-pay | Admitting: Adult Health

## 2013-08-08 ENCOUNTER — Ambulatory Visit (INDEPENDENT_AMBULATORY_CARE_PROVIDER_SITE_OTHER): Payer: Medicare Other | Admitting: Adult Health

## 2013-08-08 VITALS — BP 110/70 | HR 71 | Resp 12 | Ht 60.0 in | Wt 103.5 lb

## 2013-08-08 DIAGNOSIS — IMO0002 Reserved for concepts with insufficient information to code with codable children: Secondary | ICD-10-CM | POA: Diagnosis not present

## 2013-08-08 DIAGNOSIS — S40022S Contusion of left upper arm, sequela: Secondary | ICD-10-CM

## 2013-08-08 LAB — CBC WITH DIFFERENTIAL/PLATELET
Basophils Relative: 0.3 % (ref 0.0–3.0)
Eosinophils Absolute: 0.1 10*3/uL (ref 0.0–0.7)
MCHC: 33.7 g/dL (ref 30.0–36.0)
MCV: 95.6 fl (ref 78.0–100.0)
Monocytes Absolute: 0.9 10*3/uL (ref 0.1–1.0)
Neutrophils Relative %: 70.8 % (ref 43.0–77.0)
Platelets: 285 10*3/uL (ref 150.0–400.0)

## 2013-08-08 NOTE — Patient Instructions (Addendum)
  Continue to keep your arm in a sling for a few more days to rest it.  Do not take the Xarelto tonight. Resume the medication tomorrow with dinner.  If you notice any increased bruising please contact the office immediately.

## 2013-08-08 NOTE — Assessment & Plan Note (Signed)
Stable. Hgb unchanged from 9 months ago. Patient will hold xarelto tonight and then resume tomorrow with continued observation of arm. If any s/s of progressing hematoma she is to call immediately.

## 2013-08-08 NOTE — Progress Notes (Signed)
  Subjective:    Patient ID: Carol Vega, female    DOB: [birth year], 79 y.o.   MRN: [id number]  HPI  Patient is a very pleasant 79 year old female with hx of afib, arterial embolus and thrombosis of lower extremity (2013) and started on xarelto by Dr. Wyn Quaker who presents to clinic for f/u. She was seen in clinic yesterday status post fall on Friday. She developed bruising and hematoma or left arm on Monday. She was asked to hold her Xarelto yesterday and return for evaluation of hematoma. She is able to move her arm without pain or discomfort.  Patient reports that she is feeling very well.  Current Outpatient Prescriptions on File Prior to Visit  Medication Sig Dispense Refill  . calcium carbonate (OS-CAL) 600 MG TABS Take 600 mg by mouth 2 (two) times daily with a meal.      . diltiazem (DILACOR XR) 120 MG 24 hr capsule Take 1 capsule (120 mg total) by mouth daily.  30 capsule  5  . losartan (COZAAR) 50 MG tablet Take 1 tablet (50 mg total) by mouth daily.  30 tablet  5  . metoprolol succinate (TOPROL-XL) 25 MG 24 hr tablet TAKE ONE (1) TABLET BY MOUTH EVERY DAY  30 tablet  5  . Rivaroxaban (XARELTO) 15 MG TABS tablet Take 1 tablet (15 mg total) by mouth daily.  30 tablet  1   No current facility-administered medications on file prior to visit.     Review of Systems  Respiratory: Negative.   Cardiovascular: Negative.   Skin:       Hematoma of left arm       Objective:   Physical Exam  Constitutional: She is oriented to person, place, and time. She appears well-developed and well-nourished. No distress.  Cardiovascular: Normal rate and regular rhythm.   Pulmonary/Chest: Effort normal. No respiratory distress.  Musculoskeletal: Normal range of motion.  Neurological: She is alert and oriented to person, place, and time.  Skin:  Hematoma stable. No s/s of progression or worsening symptoms.  Psychiatric: She has a normal mood and affect. Her behavior is normal. Judgment and thought  content normal.    BP 110/70  Pulse 71  Resp 12  Ht 5' (1.524 m)  Wt 103 lb 8 oz (46.947 kg)  BMI 20.21 kg/m2  SpO2 96%        Assessment & Plan:

## 2013-09-16 ENCOUNTER — Ambulatory Visit (INDEPENDENT_AMBULATORY_CARE_PROVIDER_SITE_OTHER): Payer: Medicare Other | Admitting: Internal Medicine

## 2013-09-16 ENCOUNTER — Encounter: Payer: Self-pay | Admitting: Internal Medicine

## 2013-09-16 VITALS — BP 110/70 | HR 68 | Temp 97.7°F | Ht 60.0 in | Wt 100.5 lb

## 2013-09-16 DIAGNOSIS — Z23 Encounter for immunization: Secondary | ICD-10-CM

## 2013-09-16 DIAGNOSIS — D649 Anemia, unspecified: Secondary | ICD-10-CM

## 2013-09-16 DIAGNOSIS — M810 Age-related osteoporosis without current pathological fracture: Secondary | ICD-10-CM | POA: Diagnosis not present

## 2013-09-16 DIAGNOSIS — S40029D Contusion of unspecified upper arm, subsequent encounter: Secondary | ICD-10-CM

## 2013-09-16 DIAGNOSIS — I4891 Unspecified atrial fibrillation: Secondary | ICD-10-CM | POA: Diagnosis not present

## 2013-09-16 DIAGNOSIS — I10 Essential (primary) hypertension: Secondary | ICD-10-CM

## 2013-09-16 DIAGNOSIS — I743 Embolism and thrombosis of arteries of the lower extremities: Secondary | ICD-10-CM

## 2013-09-16 DIAGNOSIS — E871 Hypo-osmolality and hyponatremia: Secondary | ICD-10-CM

## 2013-09-16 DIAGNOSIS — Z5189 Encounter for other specified aftercare: Secondary | ICD-10-CM

## 2013-09-17 ENCOUNTER — Encounter: Payer: Self-pay | Admitting: Internal Medicine

## 2013-09-17 ENCOUNTER — Other Ambulatory Visit: Payer: Self-pay | Admitting: Internal Medicine

## 2013-09-17 DIAGNOSIS — E876 Hypokalemia: Secondary | ICD-10-CM

## 2013-09-17 DIAGNOSIS — E871 Hypo-osmolality and hyponatremia: Secondary | ICD-10-CM

## 2013-09-17 LAB — BASIC METABOLIC PANEL
BUN: 21 mg/dL (ref 6–23)
Calcium: 9.5 mg/dL (ref 8.4–10.5)
GFR: 47.57 mL/min — ABNORMAL LOW (ref >60.00)
Potassium: 5.2 mEq/L — ABNORMAL HIGH (ref 3.5–5.1)

## 2013-09-17 LAB — TSH: TSH: 2.62 u[IU]/mL (ref 0.35–5.50)

## 2013-09-17 NOTE — Assessment & Plan Note (Signed)
Recheck sodium to confirm stable.  Last check improved/stable - 134.

## 2013-09-17 NOTE — Assessment & Plan Note (Signed)
Heart rate controlled.  On Diltiazem.  Sees Dr Lady Gary.  Stable.   Follow.

## 2013-09-17 NOTE — Assessment & Plan Note (Signed)
On Xarelto.  Seeing Dr Wyn Quaker.  Follow.

## 2013-09-17 NOTE — Assessment & Plan Note (Signed)
Continue vitamin D supplementation 

## 2013-09-17 NOTE — Progress Notes (Signed)
Subjective:    Patient ID: Carol Vega, female    DOB: 1921-09-04, 79 y.o.   MRN: [id number]  HPI 79 year old female with past history of hypertension, atrial fibrillation and arterial embolus (involving her lower extremity).  She comes in today accompanied by her daughter - for a scheduled follow up.  History obtained from both of them.  Has been doing well.  No chest pain or tightness.  No increased palpitations or fluttering.  No sob.  Sees  Dr Lady Gary.  Feels everything from a cardiac standpoint - ok.   Eating and drinking well.   Weight relatively stable.  Down a few pounds from the last couple of visits, but stable from last November.  Was admitted 1/13 for an arterial embolus to her lower extremity.  Saw Dr Wyn Quaker in follow up.  Is doing well now.  He is following her on a yearly basis.  No bowel problems.   Overall she feels she is doing well.    Past Medical History  Diagnosis Date  . Osteoporosis   . Diverticulosis   . CHF (congestive heart failure)     h/o pleural effusions  . Atrial fibrillation   . Hypertension   . Aneurysm     inter atrial septum  . Subdural hematoma     admitted 2007    Current Outpatient Prescriptions on File Prior to Visit  Medication Sig Dispense Refill  . calcium carbonate (OS-CAL) 600 MG TABS Take 600 mg by mouth 2 (two) times daily with a meal.      . diltiazem (DILACOR XR) 120 MG 24 hr capsule Take 1 capsule (120 mg total) by mouth daily.  30 capsule  5  . losartan (COZAAR) 50 MG tablet Take 1 tablet (50 mg total) by mouth daily.  30 tablet  5  . metoprolol succinate (TOPROL-XL) 25 MG 24 hr tablet TAKE ONE (1) TABLET BY MOUTH EVERY DAY  30 tablet  5  . Rivaroxaban (XARELTO) 15 MG TABS tablet Take 1 tablet (15 mg total) by mouth daily.  30 tablet  1   No current facility-administered medications on file prior to visit.    Review of Systems Patient denies any headache, lightheadedness or dizziness. No sinus or allergy symptoms.   No chest pain or  tightness. No increased heart rate or palpitations.  No increased shortness of breath, cough or congestion.  No nausea or vomiting.  No acid reflux.   No abdominal pain or cramping.  No bowel change, such as diarrhea, constipation, BRBPR or melana.  No urine change.  Overall she feels she is doing well.  Previously had arm swelling and a large hematoma.  See Raquel's note for details.  Arm better.  Hematoma has resolved.  No increased swelling.  She still notices some change in sensation when she flexes her arm fully.  No pain.  Wants to continue to monitor.  Much better.       Objective:   Physical Exam  Filed Vitals:   09/16/13 1529  BP: 110/70  Pulse: 68  Temp: 97.7 F (36.5 C)   Blood pressure recheck:  140/78, pulse 27  79 year old female in no acute distress.   HEENT:  Nares- clear.  Oropharynx - without lesions. NECK:  Supple.  Nontender.  No audible bruit.  HEART:  Appears to be irregular.  Rate controlled.   LUNGS:  No crackles or wheezing audible.  Respirations even and unlabored.  RADIAL PULSE:  Equal bilaterally.  ABDOMEN:  Soft, nontender.  Bowel sounds present and normal.  No audible abdominal bruit.    EXTREMITIES:  No increased edema present.  DP pulses palpable and equal bilaterally.  MSK:  No pain with flexion and extension of the arms.  No pain to palpation.  Hematoma resolved.        Assessment & Plan:  DISPOSITION.  Have discussed at length with her regarding the need for her to continue to live with her daughter and my concerns with her returning home to live by herself.  She is continuing to live with her daughter.    DERMATOLOGY.  Seeing Dr Orson Aloe.    HEALTH MAINTENANCE.  Physical 02/07/13.   Mammogram 10/10/12 - Birads II.     I spent 25 minutes with the pt and her daughter and more than 50% of the time was spent in consultation regarding the above.

## 2013-09-17 NOTE — Assessment & Plan Note (Addendum)
Receiving B12 injections.  Follow cbc.  Hgb 08/07/13 - 12.3.

## 2013-09-17 NOTE — Assessment & Plan Note (Signed)
Appears to have resolved.  Exam as outlined.  Follow.  Wants to continue to monitor.

## 2013-09-17 NOTE — Progress Notes (Signed)
Order placed for f/u met b.  

## 2013-09-17 NOTE — Assessment & Plan Note (Signed)
Blood pressure controlled.  On Losartan.  Follow.  Check met b.   

## 2013-09-20 ENCOUNTER — Other Ambulatory Visit (INDEPENDENT_AMBULATORY_CARE_PROVIDER_SITE_OTHER): Payer: Medicare Other

## 2013-09-20 DIAGNOSIS — E876 Hypokalemia: Secondary | ICD-10-CM | POA: Diagnosis not present

## 2013-09-20 DIAGNOSIS — E871 Hypo-osmolality and hyponatremia: Secondary | ICD-10-CM

## 2013-09-20 LAB — BASIC METABOLIC PANEL
Calcium: 9.2 mg/dL (ref 8.4–10.5)
Chloride: 97 mEq/L (ref 96–112)
Creatinine, Ser: 1.2 mg/dL (ref 0.4–1.2)
GFR: 46.16 mL/min — ABNORMAL LOW (ref >60.00)

## 2013-09-22 ENCOUNTER — Other Ambulatory Visit: Payer: Self-pay | Admitting: Internal Medicine

## 2013-09-22 DIAGNOSIS — N289 Disorder of kidney and ureter, unspecified: Secondary | ICD-10-CM

## 2013-09-22 NOTE — Progress Notes (Signed)
Orders placed for f/u labs.  

## 2013-10-11 ENCOUNTER — Other Ambulatory Visit (INDEPENDENT_AMBULATORY_CARE_PROVIDER_SITE_OTHER): Payer: Medicare Other

## 2013-10-11 DIAGNOSIS — N289 Disorder of kidney and ureter, unspecified: Secondary | ICD-10-CM

## 2013-10-11 LAB — URINALYSIS, ROUTINE W REFLEX MICROSCOPIC
Nitrite: NEGATIVE
Specific Gravity, Urine: 1.02 (ref 1.000–1.030)
pH: 6.5 (ref 5.0–8.0)

## 2013-10-11 LAB — BASIC METABOLIC PANEL
GFR: 52.3 mL/min — ABNORMAL LOW (ref >60.00)
Glucose, Bld: 116 mg/dL — ABNORMAL HIGH (ref 70–99)
Potassium: 4.1 mEq/L (ref 3.5–5.1)
Sodium: 134 mEq/L — ABNORMAL LOW (ref 135–145)

## 2013-10-11 NOTE — Addendum Note (Signed)
Addended by: Montine Circle D on: 10/11/2013 10:52 AM   Modules accepted: Orders

## 2013-10-15 ENCOUNTER — Other Ambulatory Visit: Payer: Self-pay | Admitting: Internal Medicine

## 2013-10-15 DIAGNOSIS — R319 Hematuria, unspecified: Secondary | ICD-10-CM

## 2013-10-15 NOTE — Progress Notes (Signed)
Order placed for f/u urine and culture.  

## 2013-10-25 DIAGNOSIS — I10 Essential (primary) hypertension: Secondary | ICD-10-CM | POA: Diagnosis not present

## 2013-10-25 DIAGNOSIS — I4891 Unspecified atrial fibrillation: Secondary | ICD-10-CM | POA: Diagnosis not present

## 2013-11-08 ENCOUNTER — Ambulatory Visit: Payer: Self-pay | Admitting: Podiatry

## 2013-11-16 ENCOUNTER — Inpatient Hospital Stay: Payer: Self-pay | Admitting: Internal Medicine

## 2013-11-16 DIAGNOSIS — J90 Pleural effusion, not elsewhere classified: Secondary | ICD-10-CM | POA: Diagnosis not present

## 2013-11-16 DIAGNOSIS — K5730 Diverticulosis of large intestine without perforation or abscess without bleeding: Secondary | ICD-10-CM | POA: Diagnosis present

## 2013-11-16 DIAGNOSIS — I4891 Unspecified atrial fibrillation: Secondary | ICD-10-CM | POA: Diagnosis not present

## 2013-11-16 DIAGNOSIS — J189 Pneumonia, unspecified organism: Secondary | ICD-10-CM | POA: Diagnosis not present

## 2013-11-16 DIAGNOSIS — M1990 Unspecified osteoarthritis, unspecified site: Secondary | ICD-10-CM | POA: Diagnosis present

## 2013-11-16 DIAGNOSIS — K921 Melena: Secondary | ICD-10-CM | POA: Diagnosis not present

## 2013-11-16 DIAGNOSIS — R0602 Shortness of breath: Secondary | ICD-10-CM | POA: Diagnosis not present

## 2013-11-16 DIAGNOSIS — D62 Acute posthemorrhagic anemia: Secondary | ICD-10-CM | POA: Diagnosis not present

## 2013-11-16 DIAGNOSIS — I509 Heart failure, unspecified: Secondary | ICD-10-CM | POA: Diagnosis not present

## 2013-11-16 DIAGNOSIS — Z86718 Personal history of other venous thrombosis and embolism: Secondary | ICD-10-CM | POA: Diagnosis not present

## 2013-11-16 DIAGNOSIS — Z7902 Long term (current) use of antithrombotics/antiplatelets: Secondary | ICD-10-CM | POA: Diagnosis not present

## 2013-11-16 DIAGNOSIS — I5031 Acute diastolic (congestive) heart failure: Secondary | ICD-10-CM | POA: Diagnosis present

## 2013-11-16 DIAGNOSIS — I10 Essential (primary) hypertension: Secondary | ICD-10-CM | POA: Diagnosis present

## 2013-11-16 DIAGNOSIS — M810 Age-related osteoporosis without current pathological fracture: Secondary | ICD-10-CM | POA: Diagnosis present

## 2013-11-16 DIAGNOSIS — K922 Gastrointestinal hemorrhage, unspecified: Secondary | ICD-10-CM | POA: Diagnosis not present

## 2013-11-16 DIAGNOSIS — K274 Chronic or unspecified peptic ulcer, site unspecified, with hemorrhage: Secondary | ICD-10-CM | POA: Diagnosis present

## 2013-11-16 LAB — CBC
HCT: 20 % — ABNORMAL LOW (ref 35.0–47.0)
MCV: 95 fL (ref 80–100)
RBC: 2.11 10*6/uL — ABNORMAL LOW (ref 3.80–5.20)
RDW: 13.7 % (ref 11.5–14.5)

## 2013-11-16 LAB — BASIC METABOLIC PANEL
Anion Gap: 6 — ABNORMAL LOW (ref 7–16)
BUN: 32 mg/dL — ABNORMAL HIGH (ref 7–18)
Co2: 23 mmol/L (ref 21–32)
Creatinine: 1.34 mg/dL — ABNORMAL HIGH (ref 0.60–1.30)
EGFR (African American): 40 — ABNORMAL LOW
Glucose: 169 mg/dL — ABNORMAL HIGH (ref 65–99)
Osmolality: 277 (ref 275–301)
Potassium: 4.2 mmol/L (ref 3.5–5.1)
Sodium: 133 mmol/L — ABNORMAL LOW (ref 136–145)

## 2013-11-16 LAB — TROPONIN I: Troponin-I: <0.02 ng/mL

## 2013-11-17 DIAGNOSIS — R0602 Shortness of breath: Secondary | ICD-10-CM | POA: Diagnosis not present

## 2013-11-17 DIAGNOSIS — J90 Pleural effusion, not elsewhere classified: Secondary | ICD-10-CM | POA: Diagnosis not present

## 2013-11-17 DIAGNOSIS — Z7902 Long term (current) use of antithrombotics/antiplatelets: Secondary | ICD-10-CM | POA: Diagnosis not present

## 2013-11-17 DIAGNOSIS — I4891 Unspecified atrial fibrillation: Secondary | ICD-10-CM | POA: Diagnosis not present

## 2013-11-17 DIAGNOSIS — K921 Melena: Secondary | ICD-10-CM | POA: Diagnosis not present

## 2013-11-17 DIAGNOSIS — K922 Gastrointestinal hemorrhage, unspecified: Secondary | ICD-10-CM | POA: Diagnosis not present

## 2013-11-17 DIAGNOSIS — J189 Pneumonia, unspecified organism: Secondary | ICD-10-CM | POA: Diagnosis not present

## 2013-11-17 DIAGNOSIS — D62 Acute posthemorrhagic anemia: Secondary | ICD-10-CM | POA: Diagnosis not present

## 2013-11-17 LAB — BASIC METABOLIC PANEL
Anion Gap: 6 — ABNORMAL LOW (ref 7–16)
BUN: 28 mg/dL — ABNORMAL HIGH (ref 7–18)
Calcium, Total: 8.2 mg/dL — ABNORMAL LOW (ref 8.5–10.1)
Chloride: 106 mmol/L (ref 98–107)
Co2: 23 mmol/L (ref 21–32)
Creatinine: 1.22 mg/dL (ref 0.60–1.30)
EGFR (Non-African Amer.): 39 — ABNORMAL LOW
Potassium: 4.3 mmol/L (ref 3.5–5.1)
Sodium: 135 mmol/L — ABNORMAL LOW (ref 136–145)

## 2013-11-17 LAB — CBC WITH DIFFERENTIAL/PLATELET
Basophil #: 0.1 10*3/uL (ref 0.0–0.1)
Eosinophil %: 1.9 %
HCT: 24.1 % — ABNORMAL LOW (ref 35.0–47.0)
Monocyte #: 1.1 x10 3/mm — ABNORMAL HIGH (ref 0.2–0.9)
Monocyte %: 12.3 %
Neutrophil #: 6.5 10*3/uL (ref 1.4–6.5)
Neutrophil %: 73.9 %
Platelet: 296 10*3/uL (ref 150–440)
RBC: 2.6 10*6/uL — ABNORMAL LOW (ref 3.80–5.20)
RDW: 14.1 % (ref 11.5–14.5)

## 2013-11-17 LAB — HEMOGLOBIN: HGB: 8.2 g/dL — ABNORMAL LOW (ref 12.0–16.0)

## 2013-11-18 DIAGNOSIS — D62 Acute posthemorrhagic anemia: Secondary | ICD-10-CM | POA: Diagnosis not present

## 2013-11-18 DIAGNOSIS — I4891 Unspecified atrial fibrillation: Secondary | ICD-10-CM | POA: Diagnosis not present

## 2013-11-18 DIAGNOSIS — I509 Heart failure, unspecified: Secondary | ICD-10-CM | POA: Diagnosis not present

## 2013-11-18 DIAGNOSIS — K921 Melena: Secondary | ICD-10-CM | POA: Diagnosis not present

## 2013-11-18 DIAGNOSIS — J189 Pneumonia, unspecified organism: Secondary | ICD-10-CM | POA: Diagnosis not present

## 2013-11-18 DIAGNOSIS — Z7902 Long term (current) use of antithrombotics/antiplatelets: Secondary | ICD-10-CM | POA: Diagnosis not present

## 2013-11-18 DIAGNOSIS — K922 Gastrointestinal hemorrhage, unspecified: Secondary | ICD-10-CM | POA: Diagnosis not present

## 2013-11-18 DIAGNOSIS — R0602 Shortness of breath: Secondary | ICD-10-CM | POA: Diagnosis not present

## 2013-11-18 LAB — CBC WITH DIFFERENTIAL/PLATELET
Basophil #: 0 10*3/uL (ref 0.0–0.1)
Basophil %: 0.3 %
Eosinophil %: 0.2 %
HCT: 25.8 % — ABNORMAL LOW (ref 35.0–47.0)
HGB: 8.6 g/dL — ABNORMAL LOW (ref 12.0–16.0)
MCV: 93 fL (ref 80–100)
Monocyte #: 1.1 x10 3/mm — ABNORMAL HIGH (ref 0.2–0.9)
Monocyte %: 10.6 %
Neutrophil #: 9.2 10*3/uL — ABNORMAL HIGH (ref 1.4–6.5)
Neutrophil %: 84.7 %
Platelet: 323 10*3/uL (ref 150–440)
RBC: 2.79 10*6/uL — ABNORMAL LOW (ref 3.80–5.20)
WBC: 10.9 10*3/uL (ref 3.6–11.0)

## 2013-11-19 DIAGNOSIS — D62 Acute posthemorrhagic anemia: Secondary | ICD-10-CM | POA: Diagnosis not present

## 2013-11-19 DIAGNOSIS — I4891 Unspecified atrial fibrillation: Secondary | ICD-10-CM | POA: Diagnosis not present

## 2013-11-19 DIAGNOSIS — K922 Gastrointestinal hemorrhage, unspecified: Secondary | ICD-10-CM | POA: Diagnosis not present

## 2013-11-19 DIAGNOSIS — R0602 Shortness of breath: Secondary | ICD-10-CM | POA: Diagnosis not present

## 2013-11-19 DIAGNOSIS — J189 Pneumonia, unspecified organism: Secondary | ICD-10-CM | POA: Diagnosis not present

## 2013-11-19 LAB — CBC WITH DIFFERENTIAL/PLATELET
Eosinophil #: 0.1 10*3/uL (ref 0.0–0.7)
HCT: 24.1 % — ABNORMAL LOW (ref 35.0–47.0)
HGB: 8.4 g/dL — ABNORMAL LOW (ref 12.0–16.0)
Lymphocyte #: 0.6 10*3/uL — ABNORMAL LOW (ref 1.0–3.6)
Lymphocyte %: 5.7 %
MCH: 31.4 pg (ref 26.0–34.0)
MCV: 91 fL (ref 80–100)
Neutrophil #: 7.8 10*3/uL — ABNORMAL HIGH (ref 1.4–6.5)
Neutrophil %: 80.7 %
Platelet: 344 10*3/uL (ref 150–440)
RBC: 2.67 10*6/uL — ABNORMAL LOW (ref 3.80–5.20)
RDW: 14.2 % (ref 11.5–14.5)

## 2013-11-19 LAB — CREATININE, SERUM: EGFR (African American): 52 — ABNORMAL LOW

## 2013-11-21 DIAGNOSIS — L8992 Pressure ulcer of unspecified site, stage 2: Secondary | ICD-10-CM | POA: Diagnosis not present

## 2013-11-21 DIAGNOSIS — M159 Polyosteoarthritis, unspecified: Secondary | ICD-10-CM | POA: Diagnosis not present

## 2013-11-21 DIAGNOSIS — I4891 Unspecified atrial fibrillation: Secondary | ICD-10-CM | POA: Diagnosis not present

## 2013-11-21 DIAGNOSIS — K5730 Diverticulosis of large intestine without perforation or abscess without bleeding: Secondary | ICD-10-CM | POA: Diagnosis not present

## 2013-11-21 DIAGNOSIS — I10 Essential (primary) hypertension: Secondary | ICD-10-CM | POA: Diagnosis not present

## 2013-11-21 DIAGNOSIS — D649 Anemia, unspecified: Secondary | ICD-10-CM | POA: Diagnosis not present

## 2013-11-21 DIAGNOSIS — I509 Heart failure, unspecified: Secondary | ICD-10-CM | POA: Diagnosis not present

## 2013-11-21 DIAGNOSIS — L89309 Pressure ulcer of unspecified buttock, unspecified stage: Secondary | ICD-10-CM | POA: Diagnosis not present

## 2013-11-21 DIAGNOSIS — K922 Gastrointestinal hemorrhage, unspecified: Secondary | ICD-10-CM | POA: Diagnosis not present

## 2013-11-21 DIAGNOSIS — M810 Age-related osteoporosis without current pathological fracture: Secondary | ICD-10-CM | POA: Diagnosis not present

## 2013-11-22 DIAGNOSIS — K5730 Diverticulosis of large intestine without perforation or abscess without bleeding: Secondary | ICD-10-CM | POA: Diagnosis not present

## 2013-11-22 DIAGNOSIS — M159 Polyosteoarthritis, unspecified: Secondary | ICD-10-CM | POA: Diagnosis not present

## 2013-11-22 DIAGNOSIS — L89309 Pressure ulcer of unspecified buttock, unspecified stage: Secondary | ICD-10-CM | POA: Diagnosis not present

## 2013-11-22 DIAGNOSIS — L8992 Pressure ulcer of unspecified site, stage 2: Secondary | ICD-10-CM | POA: Diagnosis not present

## 2013-11-22 DIAGNOSIS — K922 Gastrointestinal hemorrhage, unspecified: Secondary | ICD-10-CM | POA: Diagnosis not present

## 2013-11-22 DIAGNOSIS — I509 Heart failure, unspecified: Secondary | ICD-10-CM | POA: Diagnosis not present

## 2013-11-25 DIAGNOSIS — K5730 Diverticulosis of large intestine without perforation or abscess without bleeding: Secondary | ICD-10-CM | POA: Diagnosis not present

## 2013-11-25 DIAGNOSIS — M159 Polyosteoarthritis, unspecified: Secondary | ICD-10-CM | POA: Diagnosis not present

## 2013-11-25 DIAGNOSIS — I509 Heart failure, unspecified: Secondary | ICD-10-CM | POA: Diagnosis not present

## 2013-11-25 DIAGNOSIS — L8992 Pressure ulcer of unspecified site, stage 2: Secondary | ICD-10-CM | POA: Diagnosis not present

## 2013-11-25 DIAGNOSIS — L89309 Pressure ulcer of unspecified buttock, unspecified stage: Secondary | ICD-10-CM | POA: Diagnosis not present

## 2013-11-25 DIAGNOSIS — K922 Gastrointestinal hemorrhage, unspecified: Secondary | ICD-10-CM | POA: Diagnosis not present

## 2013-11-27 DIAGNOSIS — L89309 Pressure ulcer of unspecified buttock, unspecified stage: Secondary | ICD-10-CM | POA: Diagnosis not present

## 2013-11-27 DIAGNOSIS — K922 Gastrointestinal hemorrhage, unspecified: Secondary | ICD-10-CM | POA: Diagnosis not present

## 2013-11-27 DIAGNOSIS — L8992 Pressure ulcer of unspecified site, stage 2: Secondary | ICD-10-CM | POA: Diagnosis not present

## 2013-11-27 DIAGNOSIS — K5730 Diverticulosis of large intestine without perforation or abscess without bleeding: Secondary | ICD-10-CM | POA: Diagnosis not present

## 2013-11-27 DIAGNOSIS — I509 Heart failure, unspecified: Secondary | ICD-10-CM | POA: Diagnosis not present

## 2013-11-27 DIAGNOSIS — M159 Polyosteoarthritis, unspecified: Secondary | ICD-10-CM | POA: Diagnosis not present

## 2013-11-28 ENCOUNTER — Telehealth: Payer: Self-pay | Admitting: Internal Medicine

## 2013-11-28 DIAGNOSIS — K921 Melena: Secondary | ICD-10-CM | POA: Diagnosis not present

## 2013-11-28 DIAGNOSIS — I4891 Unspecified atrial fibrillation: Secondary | ICD-10-CM | POA: Diagnosis not present

## 2013-11-28 DIAGNOSIS — I10 Essential (primary) hypertension: Secondary | ICD-10-CM | POA: Diagnosis not present

## 2013-11-28 NOTE — Telephone Encounter (Signed)
Carol Vega came in today to schedule ms Mcquaid a hospital follow up appointment she was discharged from [facility] 12/30  She had gi bleed and then she was diagnosed with congestion heart failure.  Carol Vega wanted to know if you wanted her to see a gi dr.  She is on protinix  This is not covered through her insurance. Her hospital follow up 12/05/13

## 2013-11-28 NOTE — Telephone Encounter (Signed)
Yes.  Will need to see GI.  Has she seen a GI specialist previously?  If so, let me know who and I will make referral.  Regarding the protonix, does she have the name of the medications her insurance company will cover (i.e., nexium, prilosec, etc).  Can change to one of these medications.  How is she doing now?

## 2013-11-28 NOTE — Telephone Encounter (Signed)
Please advise 

## 2013-11-29 ENCOUNTER — Other Ambulatory Visit: Payer: Self-pay | Admitting: *Deleted

## 2013-11-29 DIAGNOSIS — I509 Heart failure, unspecified: Secondary | ICD-10-CM | POA: Diagnosis not present

## 2013-11-29 DIAGNOSIS — L8992 Pressure ulcer of unspecified site, stage 2: Secondary | ICD-10-CM | POA: Diagnosis not present

## 2013-11-29 DIAGNOSIS — M159 Polyosteoarthritis, unspecified: Secondary | ICD-10-CM | POA: Diagnosis not present

## 2013-11-29 DIAGNOSIS — L89309 Pressure ulcer of unspecified buttock, unspecified stage: Secondary | ICD-10-CM | POA: Diagnosis not present

## 2013-11-29 DIAGNOSIS — K5730 Diverticulosis of large intestine without perforation or abscess without bleeding: Secondary | ICD-10-CM | POA: Diagnosis not present

## 2013-11-29 DIAGNOSIS — K922 Gastrointestinal hemorrhage, unspecified: Secondary | ICD-10-CM | POA: Diagnosis not present

## 2013-11-29 MED ORDER — ESOMEPRAZOLE MAGNESIUM 40 MG PO CPDR: 40.0000 mg | DELAYED_RELEASE_CAPSULE | Freq: Every day | ORAL | Status: DC

## 2013-11-29 NOTE — Telephone Encounter (Signed)
Noted. Thanks.

## 2013-11-29 NOTE — Telephone Encounter (Signed)
States that Carol Vega is doing better, and she has never seen a GI specialist before. Would like to get in with Dr. Gustavo Lah or someone in the office if possible. She does not have her medication formulary, so I informed her that I will send in Nexium and see if that will be covered.

## 2013-11-29 NOTE — Telephone Encounter (Signed)
Correction: Carol Vega is doing better

## 2013-12-03 DIAGNOSIS — K922 Gastrointestinal hemorrhage, unspecified: Secondary | ICD-10-CM | POA: Diagnosis not present

## 2013-12-03 DIAGNOSIS — I509 Heart failure, unspecified: Secondary | ICD-10-CM | POA: Diagnosis not present

## 2013-12-03 DIAGNOSIS — L89309 Pressure ulcer of unspecified buttock, unspecified stage: Secondary | ICD-10-CM | POA: Diagnosis not present

## 2013-12-03 DIAGNOSIS — L8992 Pressure ulcer of unspecified site, stage 2: Secondary | ICD-10-CM | POA: Diagnosis not present

## 2013-12-03 DIAGNOSIS — M159 Polyosteoarthritis, unspecified: Secondary | ICD-10-CM | POA: Diagnosis not present

## 2013-12-03 DIAGNOSIS — K5730 Diverticulosis of large intestine without perforation or abscess without bleeding: Secondary | ICD-10-CM | POA: Diagnosis not present

## 2013-12-05 ENCOUNTER — Ambulatory Visit (INDEPENDENT_AMBULATORY_CARE_PROVIDER_SITE_OTHER): Payer: Medicare Other | Admitting: Internal Medicine

## 2013-12-05 ENCOUNTER — Telehealth: Payer: Self-pay | Admitting: *Deleted

## 2013-12-05 ENCOUNTER — Encounter: Payer: Self-pay | Admitting: Internal Medicine

## 2013-12-05 VITALS — BP 120/80 | HR 84 | Temp 97.4°F | Resp 18 | Ht 60.0 in

## 2013-12-05 DIAGNOSIS — D649 Anemia, unspecified: Secondary | ICD-10-CM

## 2013-12-05 DIAGNOSIS — I509 Heart failure, unspecified: Secondary | ICD-10-CM

## 2013-12-05 DIAGNOSIS — I10 Essential (primary) hypertension: Secondary | ICD-10-CM

## 2013-12-05 DIAGNOSIS — E871 Hypo-osmolality and hyponatremia: Secondary | ICD-10-CM

## 2013-12-05 DIAGNOSIS — I4891 Unspecified atrial fibrillation: Secondary | ICD-10-CM | POA: Diagnosis not present

## 2013-12-05 DIAGNOSIS — K922 Gastrointestinal hemorrhage, unspecified: Secondary | ICD-10-CM

## 2013-12-05 DIAGNOSIS — I743 Embolism and thrombosis of arteries of the lower extremities: Secondary | ICD-10-CM

## 2013-12-05 NOTE — Progress Notes (Signed)
Pre-visit discussion using our clinic review tool. No additional management support is needed unless otherwise documented below in the visit note.  

## 2013-12-05 NOTE — Telephone Encounter (Signed)
Carol Vega with [name] called to let you know that Carol Vega has a pressure sore on her bottom that has opened up. She applied a hydrocolloid dressing & wanted to make sure that you are okay with that.

## 2013-12-05 NOTE — Telephone Encounter (Signed)
Christy notified.

## 2013-12-05 NOTE — Telephone Encounter (Signed)
ok 

## 2013-12-06 ENCOUNTER — Telehealth: Payer: Self-pay | Admitting: *Deleted

## 2013-12-06 DIAGNOSIS — I509 Heart failure, unspecified: Secondary | ICD-10-CM | POA: Diagnosis not present

## 2013-12-06 DIAGNOSIS — L8992 Pressure ulcer of unspecified site, stage 2: Secondary | ICD-10-CM | POA: Diagnosis not present

## 2013-12-06 DIAGNOSIS — M159 Polyosteoarthritis, unspecified: Secondary | ICD-10-CM | POA: Diagnosis not present

## 2013-12-06 DIAGNOSIS — K922 Gastrointestinal hemorrhage, unspecified: Secondary | ICD-10-CM | POA: Diagnosis not present

## 2013-12-06 DIAGNOSIS — K5730 Diverticulosis of large intestine without perforation or abscess without bleeding: Secondary | ICD-10-CM | POA: Diagnosis not present

## 2013-12-06 DIAGNOSIS — L89309 Pressure ulcer of unspecified buttock, unspecified stage: Secondary | ICD-10-CM | POA: Diagnosis not present

## 2013-12-06 LAB — CBC WITH DIFFERENTIAL/PLATELET
Basophils Absolute: 0 10*3/uL (ref 0.0–0.1)
Basophils Relative: 0.1 % (ref 0.0–3.0)
EOS ABS: 0.1 10*3/uL (ref 0.0–0.7)
EOS PCT: 0.9 % (ref 0.0–5.0)
HEMATOCRIT: 30 % — AB (ref 36.0–46.0)
HEMOGLOBIN: 9.9 g/dL — AB (ref 12.0–15.0)
LYMPHS ABS: 0.6 10*3/uL — AB (ref 0.7–4.0)
Lymphocytes Relative: 7.9 % — ABNORMAL LOW (ref 12.0–46.0)
MCHC: 32.8 g/dL (ref 30.0–36.0)
MCV: 93.6 fl (ref 78.0–100.0)
MONO ABS: 0.5 10*3/uL (ref 0.1–1.0)
Monocytes Relative: 7.3 % (ref 3.0–12.0)
NEUTROS ABS: 6 10*3/uL (ref 1.4–7.7)
NEUTROS PCT: 83.8 % — AB (ref 43.0–77.0)
Platelets: 375 10*3/uL (ref 150.0–400.0)
RBC: 3.21 Mil/uL — ABNORMAL LOW (ref 3.87–5.11)
RDW: 14.3 % (ref 11.5–14.6)
WBC: 7.2 10*3/uL (ref 4.5–10.5)

## 2013-12-06 LAB — BASIC METABOLIC PANEL
BUN: 15 mg/dL (ref 6–23)
CO2: 23 mEq/L (ref 19–32)
CREATININE: 1.2 mg/dL (ref 0.4–1.2)
Calcium: 8.8 mg/dL (ref 8.4–10.5)
Chloride: 100 mEq/L (ref 96–112)
GFR: 43.97 mL/min — AB (ref >60.00)
GLUCOSE: 183 mg/dL — AB (ref 70–99)
POTASSIUM: 4.9 meq/L (ref 3.5–5.1)
Sodium: 132 mEq/L — ABNORMAL LOW (ref 135–145)

## 2013-12-06 NOTE — Telephone Encounter (Signed)
Called to update med list. Everything on list is accurate except the Metoprolol 25mg  (she is no longer on it). I have updated med list

## 2013-12-06 NOTE — Telephone Encounter (Signed)
LMTCB on Emily voicemal

## 2013-12-06 NOTE — Telephone Encounter (Signed)
Noted.  Keep [place] posted.  I also need to know if they can set up for an aid in the home to help with bathing, etc.

## 2013-12-06 NOTE — Telephone Encounter (Signed)
Thanks

## 2013-12-06 NOTE — Telephone Encounter (Signed)
Carol Vega with [facility] called to inform you that she saw her today & she is still doing well.

## 2013-12-07 NOTE — Telephone Encounter (Signed)
Carol Vega returned my call-they are able to set up an in home aid, all she needs is a verbal (already given) & to contact [facility] family. She left a message for them to return her call yesterday & will discuss it with them when she see's [name] next week.

## 2013-12-07 NOTE — Telephone Encounter (Signed)
Noted  

## 2013-12-08 ENCOUNTER — Encounter: Payer: Self-pay | Admitting: Internal Medicine

## 2013-12-08 ENCOUNTER — Other Ambulatory Visit: Payer: Self-pay | Admitting: Internal Medicine

## 2013-12-08 DIAGNOSIS — K922 Gastrointestinal hemorrhage, unspecified: Secondary | ICD-10-CM | POA: Insufficient documentation

## 2013-12-08 DIAGNOSIS — E871 Hypo-osmolality and hyponatremia: Secondary | ICD-10-CM

## 2013-12-08 DIAGNOSIS — I509 Heart failure, unspecified: Secondary | ICD-10-CM | POA: Insufficient documentation

## 2013-12-08 DIAGNOSIS — D649 Anemia, unspecified: Secondary | ICD-10-CM

## 2013-12-08 NOTE — Assessment & Plan Note (Signed)
Recently admitted with GI bleed.  Of xarelto now.  Transfused one unit prbc's in the hospital.  Due f/u with GI.  Check cbc today.  Receiving B12 injections.

## 2013-12-08 NOTE — Assessment & Plan Note (Signed)
Off Xarelto.  Followed Dr Lucky Cowboy.

## 2013-12-08 NOTE — Assessment & Plan Note (Signed)
Recently admitted for GI bleed.  Evaluated by GI.  Elected to postpone further w/up.  Need outpatient f/u with GI.  Check cbc today.  On iron.

## 2013-12-08 NOTE — Progress Notes (Signed)
Subjective:    Patient ID: Carol Vega, female    DOB: Mar 30, 1921, 80 y.o.   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  She comes in today accompanied by her daughter - for a hospital follow up.   History obtained from both of them.  Was recently admitted 11/16/13 - 11/19/13 for worsening sob with GI bleed.  hgb on admission was noted to be 6.7.  Her xarelto and alleve were stopped.  She was transfused one unit of prbc's.  Xray with pleural effusions.  Was initially placed on lasix.  Off now.  Saw Dr Ubaldo Glassing in follow up.  Has been doing better.   No chest pain or tightness.  No increased palpitations or fluttering.  No sob.   Eating and drinking well.   No bowel problems.   Overall she feels she is doing well.    Past Medical History  Diagnosis Date  . Osteoporosis   . Diverticulosis   . CHF (congestive heart failure)     h/o pleural effusions  . Atrial fibrillation   . Hypertension   . Aneurysm     inter atrial septum  . Subdural hematoma     admitted 2007    Current Outpatient Prescriptions on File Prior to Visit  Medication Sig Dispense Refill  . calcium carbonate (OS-CAL) 600 MG TABS Take 600 mg by mouth 2 (two) times daily with a meal.      . diltiazem (DILACOR XR) 120 MG 24 hr capsule Take 1 capsule (120 mg total) by mouth daily.  30 capsule  5  . esomeprazole (NEXIUM) 40 MG capsule Take 1 capsule (40 mg total) by mouth daily at 12 noon.  30 capsule  5  . losartan (COZAAR) 50 MG tablet Take 1 tablet (50 mg total) by mouth daily.  30 tablet  5   No current facility-administered medications on file prior to visit.    Review of Systems Patient denies any headache, lightheadedness or dizziness.  No sinus or allergy symptoms.   No chest pain or tightness. No increased heart rate or palpitations.  No increased shortness of breath now.  No cough or congestion.  No nausea or  vomiting.  No acid reflux.   No abdominal pain or cramping.  No bowel change, such as diarrhea, constipation, BRBPR or melana now.  Prior to her admission had noticed blood in her stool.  On iron now.   No urine change.  Overall she feels she is doing better.        Objective:   Physical Exam  Filed Vitals:   12/05/13 1537  BP: 120/80  Pulse: 84  Temp: 97.4 F (36.3 C)  Resp: 75   80 year old female in no acute distress.   HEENT:  Nares- clear.  Oropharynx - without lesions. NECK:  Supple.  Nontender.  No audible bruit.  HEART:  Appears to be irregular.  Rate controlled.   LUNGS:  No crackles or wheezing audible.  Respirations even and unlabored.  RADIAL PULSE:  Equal bilaterally.  ABDOMEN:  Soft, nontender.  Bowel sounds present and normal.  No audible abdominal bruit.    EXTREMITIES:  No increased edema present.  DP pulses palpable and equal bilaterally.        Assessment & Plan:  DISPOSITION.  Receiving home health.  Daughter request for aid.  Discuss with [facility].   HEALTH MAINTENANCE.  Physical 02/07/13.   Mammogram 10/10/12 - Birads II.   Due f/u mammogram.    I spent 25 minutes with the pt and her daughter and more than 50% of the time was spent in consultation regarding the above.

## 2013-12-08 NOTE — Assessment & Plan Note (Signed)
Heart rate controlled.  On Diltiazem.  Sees Dr Fath.  Stable.   Follow.  Off xarelto given recent GI bleed.    

## 2013-12-08 NOTE — Assessment & Plan Note (Signed)
Will follow sodium to confirm stable.  Last check in the hospital 135.

## 2013-12-08 NOTE — Assessment & Plan Note (Signed)
Blood pressure controlled.  On Losartan.  Follow.  Check met b.   

## 2013-12-08 NOTE — Assessment & Plan Note (Signed)
Initially diuresed with lasix.  Off now.  Had ECHO 11/19/13 that revealed EF 60-65%, mildly dilated left atrium and right atrium, mild mitral valve regurgitation and moderate tricuspid regurgitation.  Also had mildly elevated pulmonary artery pressure.  Doing better now.  Seeing Dr Fath.    

## 2013-12-08 NOTE — Progress Notes (Signed)
Order placed for f/u labs.  

## 2013-12-09 ENCOUNTER — Encounter: Payer: Self-pay | Admitting: Emergency Medicine

## 2013-12-09 ENCOUNTER — Other Ambulatory Visit: Payer: Self-pay | Admitting: Internal Medicine

## 2013-12-09 DIAGNOSIS — K922 Gastrointestinal hemorrhage, unspecified: Secondary | ICD-10-CM

## 2013-12-09 NOTE — Progress Notes (Signed)
Order placed for gi referral.

## 2013-12-10 DIAGNOSIS — L89309 Pressure ulcer of unspecified buttock, unspecified stage: Secondary | ICD-10-CM | POA: Diagnosis not present

## 2013-12-10 DIAGNOSIS — I509 Heart failure, unspecified: Secondary | ICD-10-CM | POA: Diagnosis not present

## 2013-12-10 DIAGNOSIS — K922 Gastrointestinal hemorrhage, unspecified: Secondary | ICD-10-CM | POA: Diagnosis not present

## 2013-12-10 DIAGNOSIS — L8992 Pressure ulcer of unspecified site, stage 2: Secondary | ICD-10-CM | POA: Diagnosis not present

## 2013-12-10 DIAGNOSIS — M159 Polyosteoarthritis, unspecified: Secondary | ICD-10-CM | POA: Diagnosis not present

## 2013-12-10 DIAGNOSIS — K5730 Diverticulosis of large intestine without perforation or abscess without bleeding: Secondary | ICD-10-CM | POA: Diagnosis not present

## 2013-12-11 DIAGNOSIS — L578 Other skin changes due to chronic exposure to nonionizing radiation: Secondary | ICD-10-CM | POA: Diagnosis not present

## 2013-12-11 DIAGNOSIS — L821 Other seborrheic keratosis: Secondary | ICD-10-CM | POA: Diagnosis not present

## 2013-12-11 DIAGNOSIS — D500 Iron deficiency anemia secondary to blood loss (chronic): Secondary | ICD-10-CM | POA: Diagnosis not present

## 2013-12-11 DIAGNOSIS — L905 Scar conditions and fibrosis of skin: Secondary | ICD-10-CM | POA: Diagnosis not present

## 2013-12-11 DIAGNOSIS — Z85828 Personal history of other malignant neoplasm of skin: Secondary | ICD-10-CM | POA: Diagnosis not present

## 2013-12-12 DIAGNOSIS — I509 Heart failure, unspecified: Secondary | ICD-10-CM | POA: Diagnosis not present

## 2013-12-12 DIAGNOSIS — K5730 Diverticulosis of large intestine without perforation or abscess without bleeding: Secondary | ICD-10-CM | POA: Diagnosis not present

## 2013-12-12 DIAGNOSIS — M159 Polyosteoarthritis, unspecified: Secondary | ICD-10-CM | POA: Diagnosis not present

## 2013-12-12 DIAGNOSIS — L8992 Pressure ulcer of unspecified site, stage 2: Secondary | ICD-10-CM | POA: Diagnosis not present

## 2013-12-12 DIAGNOSIS — L89309 Pressure ulcer of unspecified buttock, unspecified stage: Secondary | ICD-10-CM | POA: Diagnosis not present

## 2013-12-12 DIAGNOSIS — K922 Gastrointestinal hemorrhage, unspecified: Secondary | ICD-10-CM | POA: Diagnosis not present

## 2013-12-17 DIAGNOSIS — I509 Heart failure, unspecified: Secondary | ICD-10-CM | POA: Diagnosis not present

## 2013-12-17 DIAGNOSIS — L89309 Pressure ulcer of unspecified buttock, unspecified stage: Secondary | ICD-10-CM | POA: Diagnosis not present

## 2013-12-17 DIAGNOSIS — K5730 Diverticulosis of large intestine without perforation or abscess without bleeding: Secondary | ICD-10-CM | POA: Diagnosis not present

## 2013-12-17 DIAGNOSIS — M159 Polyosteoarthritis, unspecified: Secondary | ICD-10-CM | POA: Diagnosis not present

## 2013-12-17 DIAGNOSIS — L8992 Pressure ulcer of unspecified site, stage 2: Secondary | ICD-10-CM | POA: Diagnosis not present

## 2013-12-17 DIAGNOSIS — K922 Gastrointestinal hemorrhage, unspecified: Secondary | ICD-10-CM | POA: Diagnosis not present

## 2013-12-19 DIAGNOSIS — I509 Heart failure, unspecified: Secondary | ICD-10-CM | POA: Diagnosis not present

## 2013-12-19 DIAGNOSIS — M159 Polyosteoarthritis, unspecified: Secondary | ICD-10-CM | POA: Diagnosis not present

## 2013-12-19 DIAGNOSIS — L89309 Pressure ulcer of unspecified buttock, unspecified stage: Secondary | ICD-10-CM | POA: Diagnosis not present

## 2013-12-19 DIAGNOSIS — K922 Gastrointestinal hemorrhage, unspecified: Secondary | ICD-10-CM | POA: Diagnosis not present

## 2013-12-19 DIAGNOSIS — K5730 Diverticulosis of large intestine without perforation or abscess without bleeding: Secondary | ICD-10-CM | POA: Diagnosis not present

## 2013-12-19 DIAGNOSIS — L8992 Pressure ulcer of unspecified site, stage 2: Secondary | ICD-10-CM | POA: Diagnosis not present

## 2013-12-24 DIAGNOSIS — M159 Polyosteoarthritis, unspecified: Secondary | ICD-10-CM | POA: Diagnosis not present

## 2013-12-24 DIAGNOSIS — K922 Gastrointestinal hemorrhage, unspecified: Secondary | ICD-10-CM | POA: Diagnosis not present

## 2013-12-24 DIAGNOSIS — L8992 Pressure ulcer of unspecified site, stage 2: Secondary | ICD-10-CM | POA: Diagnosis not present

## 2013-12-24 DIAGNOSIS — K5730 Diverticulosis of large intestine without perforation or abscess without bleeding: Secondary | ICD-10-CM | POA: Diagnosis not present

## 2013-12-24 DIAGNOSIS — L89309 Pressure ulcer of unspecified buttock, unspecified stage: Secondary | ICD-10-CM | POA: Diagnosis not present

## 2013-12-24 DIAGNOSIS — I509 Heart failure, unspecified: Secondary | ICD-10-CM | POA: Diagnosis not present

## 2013-12-25 ENCOUNTER — Other Ambulatory Visit: Payer: Self-pay | Admitting: Internal Medicine

## 2013-12-26 DIAGNOSIS — K922 Gastrointestinal hemorrhage, unspecified: Secondary | ICD-10-CM | POA: Diagnosis not present

## 2013-12-26 DIAGNOSIS — K5730 Diverticulosis of large intestine without perforation or abscess without bleeding: Secondary | ICD-10-CM | POA: Diagnosis not present

## 2013-12-26 DIAGNOSIS — M159 Polyosteoarthritis, unspecified: Secondary | ICD-10-CM | POA: Diagnosis not present

## 2013-12-26 DIAGNOSIS — L8992 Pressure ulcer of unspecified site, stage 2: Secondary | ICD-10-CM | POA: Diagnosis not present

## 2013-12-26 DIAGNOSIS — L89309 Pressure ulcer of unspecified buttock, unspecified stage: Secondary | ICD-10-CM | POA: Diagnosis not present

## 2013-12-26 DIAGNOSIS — I509 Heart failure, unspecified: Secondary | ICD-10-CM | POA: Diagnosis not present

## 2013-12-27 ENCOUNTER — Ambulatory Visit: Payer: Medicare Other | Admitting: Internal Medicine

## 2013-12-27 DIAGNOSIS — J449 Chronic obstructive pulmonary disease, unspecified: Secondary | ICD-10-CM | POA: Diagnosis not present

## 2013-12-27 DIAGNOSIS — I4891 Unspecified atrial fibrillation: Secondary | ICD-10-CM | POA: Diagnosis not present

## 2013-12-30 DIAGNOSIS — M159 Polyosteoarthritis, unspecified: Secondary | ICD-10-CM | POA: Diagnosis not present

## 2013-12-30 DIAGNOSIS — I509 Heart failure, unspecified: Secondary | ICD-10-CM | POA: Diagnosis not present

## 2013-12-30 DIAGNOSIS — L8992 Pressure ulcer of unspecified site, stage 2: Secondary | ICD-10-CM | POA: Diagnosis not present

## 2013-12-30 DIAGNOSIS — K5730 Diverticulosis of large intestine without perforation or abscess without bleeding: Secondary | ICD-10-CM | POA: Diagnosis not present

## 2013-12-30 DIAGNOSIS — K922 Gastrointestinal hemorrhage, unspecified: Secondary | ICD-10-CM | POA: Diagnosis not present

## 2013-12-30 DIAGNOSIS — L89309 Pressure ulcer of unspecified buttock, unspecified stage: Secondary | ICD-10-CM | POA: Diagnosis not present

## 2013-12-31 DIAGNOSIS — K5730 Diverticulosis of large intestine without perforation or abscess without bleeding: Secondary | ICD-10-CM | POA: Diagnosis not present

## 2013-12-31 DIAGNOSIS — M159 Polyosteoarthritis, unspecified: Secondary | ICD-10-CM | POA: Diagnosis not present

## 2013-12-31 DIAGNOSIS — K922 Gastrointestinal hemorrhage, unspecified: Secondary | ICD-10-CM | POA: Diagnosis not present

## 2013-12-31 DIAGNOSIS — I509 Heart failure, unspecified: Secondary | ICD-10-CM | POA: Diagnosis not present

## 2013-12-31 DIAGNOSIS — L89309 Pressure ulcer of unspecified buttock, unspecified stage: Secondary | ICD-10-CM | POA: Diagnosis not present

## 2013-12-31 DIAGNOSIS — L8992 Pressure ulcer of unspecified site, stage 2: Secondary | ICD-10-CM | POA: Diagnosis not present

## 2014-01-01 ENCOUNTER — Other Ambulatory Visit (INDEPENDENT_AMBULATORY_CARE_PROVIDER_SITE_OTHER): Payer: Medicare Other

## 2014-01-01 DIAGNOSIS — D649 Anemia, unspecified: Secondary | ICD-10-CM

## 2014-01-01 DIAGNOSIS — Z1211 Encounter for screening for malignant neoplasm of colon: Secondary | ICD-10-CM

## 2014-01-01 DIAGNOSIS — E1059 Type 1 diabetes mellitus with other circulatory complications: Secondary | ICD-10-CM

## 2014-01-01 DIAGNOSIS — E871 Hypo-osmolality and hyponatremia: Secondary | ICD-10-CM

## 2014-01-01 LAB — CBC WITH DIFFERENTIAL/PLATELET
Basophils Absolute: 0 10*3/uL (ref 0.0–0.1)
Basophils Relative: 0.5 % (ref 0.0–3.0)
Eosinophils Absolute: 0.1 10*3/uL (ref 0.0–0.7)
Eosinophils Relative: 1.8 % (ref 0.0–5.0)
HCT: 35.5 % — ABNORMAL LOW (ref 36.0–46.0)
Hemoglobin: 11.3 g/dL — ABNORMAL LOW (ref 12.0–15.0)
Lymphocytes Relative: 19.3 % (ref 12.0–46.0)
Lymphs Abs: 1.4 10*3/uL (ref 0.7–4.0)
MCHC: 31.8 g/dL (ref 30.0–36.0)
MCV: 94.8 fl (ref 78.0–100.0)
Monocytes Absolute: 0.6 10*3/uL (ref 0.1–1.0)
Monocytes Relative: 8.3 % (ref 3.0–12.0)
Neutro Abs: 5 10*3/uL (ref 1.4–7.7)
Neutrophils Relative %: 70.1 % (ref 43.0–77.0)
Platelets: 339 10*3/uL (ref 150.0–400.0)
RBC: 3.75 Mil/uL — ABNORMAL LOW (ref 3.87–5.11)
RDW: 14.7 % — ABNORMAL HIGH (ref 11.5–14.6)
WBC: 7.1 10*3/uL (ref 4.5–10.5)

## 2014-01-01 LAB — BASIC METABOLIC PANEL
BUN: 20 mg/dL (ref 6–23)
CHLORIDE: 102 meq/L (ref 96–112)
CO2: 28 meq/L (ref 19–32)
CREATININE: 1.1 mg/dL (ref 0.4–1.2)
Calcium: 9.3 mg/dL (ref 8.4–10.5)
GFR: 51.14 mL/min — ABNORMAL LOW (ref >60.00)
GLUCOSE: 122 mg/dL — AB (ref 70–99)
Potassium: 4.4 mEq/L (ref 3.5–5.1)
SODIUM: 137 meq/L (ref 135–145)

## 2014-01-01 LAB — FERRITIN: Ferritin: 36.1 ng/mL (ref 10.0–291.0)

## 2014-01-02 ENCOUNTER — Other Ambulatory Visit: Payer: Medicare Other

## 2014-01-02 ENCOUNTER — Encounter: Payer: Self-pay | Admitting: *Deleted

## 2014-01-03 DIAGNOSIS — L89309 Pressure ulcer of unspecified buttock, unspecified stage: Secondary | ICD-10-CM | POA: Diagnosis not present

## 2014-01-03 DIAGNOSIS — K5730 Diverticulosis of large intestine without perforation or abscess without bleeding: Secondary | ICD-10-CM | POA: Diagnosis not present

## 2014-01-03 DIAGNOSIS — K922 Gastrointestinal hemorrhage, unspecified: Secondary | ICD-10-CM | POA: Diagnosis not present

## 2014-01-03 DIAGNOSIS — L8992 Pressure ulcer of unspecified site, stage 2: Secondary | ICD-10-CM | POA: Diagnosis not present

## 2014-01-03 DIAGNOSIS — I509 Heart failure, unspecified: Secondary | ICD-10-CM | POA: Diagnosis not present

## 2014-01-03 DIAGNOSIS — M159 Polyosteoarthritis, unspecified: Secondary | ICD-10-CM | POA: Diagnosis not present

## 2014-01-09 DIAGNOSIS — M159 Polyosteoarthritis, unspecified: Secondary | ICD-10-CM | POA: Diagnosis not present

## 2014-01-09 DIAGNOSIS — L89309 Pressure ulcer of unspecified buttock, unspecified stage: Secondary | ICD-10-CM | POA: Diagnosis not present

## 2014-01-09 DIAGNOSIS — K5730 Diverticulosis of large intestine without perforation or abscess without bleeding: Secondary | ICD-10-CM | POA: Diagnosis not present

## 2014-01-09 DIAGNOSIS — K922 Gastrointestinal hemorrhage, unspecified: Secondary | ICD-10-CM | POA: Diagnosis not present

## 2014-01-09 DIAGNOSIS — I509 Heart failure, unspecified: Secondary | ICD-10-CM | POA: Diagnosis not present

## 2014-01-09 DIAGNOSIS — L8992 Pressure ulcer of unspecified site, stage 2: Secondary | ICD-10-CM | POA: Diagnosis not present

## 2014-01-13 ENCOUNTER — Telehealth: Payer: Self-pay | Admitting: *Deleted

## 2014-01-13 ENCOUNTER — Other Ambulatory Visit: Payer: Self-pay | Admitting: *Deleted

## 2014-01-13 MED ORDER — FERROUS SULFATE 325 (65 FE) MG PO TABS: 325.0000 mg | ORAL_TABLET | Freq: Three times a day (TID) | ORAL | Status: DC

## 2014-01-13 NOTE — Telephone Encounter (Signed)
Refill Request  Iron 325 mg tab  Take one tablet 3 times daily

## 2014-01-13 NOTE — Telephone Encounter (Signed)
Rx refilled.

## 2014-01-15 DIAGNOSIS — L89309 Pressure ulcer of unspecified buttock, unspecified stage: Secondary | ICD-10-CM | POA: Diagnosis not present

## 2014-01-15 DIAGNOSIS — L8992 Pressure ulcer of unspecified site, stage 2: Secondary | ICD-10-CM | POA: Diagnosis not present

## 2014-01-15 DIAGNOSIS — K922 Gastrointestinal hemorrhage, unspecified: Secondary | ICD-10-CM | POA: Diagnosis not present

## 2014-01-15 DIAGNOSIS — I509 Heart failure, unspecified: Secondary | ICD-10-CM | POA: Diagnosis not present

## 2014-01-15 DIAGNOSIS — M159 Polyosteoarthritis, unspecified: Secondary | ICD-10-CM | POA: Diagnosis not present

## 2014-01-15 DIAGNOSIS — K5730 Diverticulosis of large intestine without perforation or abscess without bleeding: Secondary | ICD-10-CM | POA: Diagnosis not present

## 2014-01-17 ENCOUNTER — Ambulatory Visit: Payer: Medicare Other | Admitting: Internal Medicine

## 2014-02-03 ENCOUNTER — Ambulatory Visit: Payer: Medicare Other | Admitting: Internal Medicine

## 2014-02-14 ENCOUNTER — Encounter (INDEPENDENT_AMBULATORY_CARE_PROVIDER_SITE_OTHER): Payer: Self-pay

## 2014-02-14 ENCOUNTER — Ambulatory Visit (INDEPENDENT_AMBULATORY_CARE_PROVIDER_SITE_OTHER): Payer: Medicare Other | Admitting: Internal Medicine

## 2014-02-14 ENCOUNTER — Encounter: Payer: Self-pay | Admitting: Internal Medicine

## 2014-02-14 VITALS — BP 120/70 | HR 88 | Temp 98.0°F | Ht <= 58 in | Wt 98.8 lb

## 2014-02-14 DIAGNOSIS — I509 Heart failure, unspecified: Secondary | ICD-10-CM

## 2014-02-14 DIAGNOSIS — D649 Anemia, unspecified: Secondary | ICD-10-CM | POA: Diagnosis not present

## 2014-02-14 DIAGNOSIS — I743 Embolism and thrombosis of arteries of the lower extremities: Secondary | ICD-10-CM | POA: Diagnosis not present

## 2014-02-14 DIAGNOSIS — I10 Essential (primary) hypertension: Secondary | ICD-10-CM

## 2014-02-14 DIAGNOSIS — Z23 Encounter for immunization: Secondary | ICD-10-CM | POA: Diagnosis not present

## 2014-02-14 DIAGNOSIS — E871 Hypo-osmolality and hyponatremia: Secondary | ICD-10-CM

## 2014-02-14 DIAGNOSIS — K922 Gastrointestinal hemorrhage, unspecified: Secondary | ICD-10-CM

## 2014-02-14 DIAGNOSIS — I4891 Unspecified atrial fibrillation: Secondary | ICD-10-CM | POA: Diagnosis not present

## 2014-02-14 DIAGNOSIS — M810 Age-related osteoporosis without current pathological fracture: Secondary | ICD-10-CM

## 2014-02-14 LAB — COMPREHENSIVE METABOLIC PANEL
ALK PHOS: 60 U/L (ref 39–117)
ALT: 15 U/L (ref 0–35)
AST: 22 U/L (ref 0–37)
Albumin: 3.7 g/dL (ref 3.5–5.2)
BILIRUBIN TOTAL: 0.4 mg/dL (ref 0.3–1.2)
BUN: 20 mg/dL (ref 6–23)
CO2: 27 mEq/L (ref 19–32)
CREATININE: 1 mg/dL (ref 0.4–1.2)
Calcium: 9.2 mg/dL (ref 8.4–10.5)
Chloride: 103 mEq/L (ref 96–112)
GFR: 53.43 mL/min — ABNORMAL LOW (ref >60.00)
GLUCOSE: 106 mg/dL — AB (ref 70–99)
Potassium: 4 mEq/L (ref 3.5–5.1)
Sodium: 137 mEq/L (ref 135–145)
Total Protein: 6.7 g/dL (ref 6.0–8.3)

## 2014-02-14 LAB — CBC WITH DIFFERENTIAL/PLATELET
BASOS PCT: 0.3 % (ref 0.0–3.0)
Basophils Absolute: 0 10*3/uL (ref 0.0–0.1)
Eosinophils Absolute: 0.2 10*3/uL (ref 0.0–0.7)
Eosinophils Relative: 2.3 % (ref 0.0–5.0)
HEMATOCRIT: 36.3 % (ref 36.0–46.0)
Hemoglobin: 12.1 g/dL (ref 12.0–15.0)
Lymphocytes Relative: 18.5 % (ref 12.0–46.0)
Lymphs Abs: 1.4 10*3/uL (ref 0.7–4.0)
MCHC: 33.3 g/dL (ref 30.0–36.0)
MCV: 92.7 fl (ref 78.0–100.0)
MONO ABS: 0.9 10*3/uL (ref 0.1–1.0)
Monocytes Relative: 11.7 % (ref 3.0–12.0)
NEUTROS PCT: 67.2 % (ref 43.0–77.0)
Neutro Abs: 4.9 10*3/uL (ref 1.4–7.7)
Platelets: 303 10*3/uL (ref 150.0–400.0)
RBC: 3.92 Mil/uL (ref 3.87–5.11)
RDW: 15.4 % — ABNORMAL HIGH (ref 11.5–14.6)
WBC: 7.3 10*3/uL (ref 4.5–10.5)

## 2014-02-14 LAB — FERRITIN: Ferritin: 36.8 ng/mL (ref 10.0–291.0)

## 2014-02-14 NOTE — Progress Notes (Signed)
Pre-visit discussion using our clinic review tool. No additional management support is needed unless otherwise documented below in the visit note.  

## 2014-02-14 NOTE — Progress Notes (Signed)
Subjective:    Patient ID: Carol Vega, female    DOB: 19-Dec-1920, 80 y.o.   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  She comes in today accompanied by her daughter - to follow up on these issues as well as for a complete physical exam.  History obtained from both of them.  Was recently admitted 11/16/13 - 11/19/13 for worsening sob with GI bleed.  Hgb on admission was noted to be 6.7.  Her xarelto and alleve were stopped.  She was transfused one unit of prbc's.  Xray with pleural effusions.  Was initially placed on lasix.  Off now.  Saw Dr Ubaldo Glassing in follow up.  Has been doing better.   No chest pain or tightness.  No increased palpitations or fluttering.  No sob.   Eating and drinking well.   No bowel problems.   Overall she feels she is doing well.  Hgb has improved.     Past Medical History  Diagnosis Date  . Osteoporosis   . Diverticulosis   . CHF (congestive heart failure)     h/o pleural effusions  . Atrial fibrillation   . Hypertension   . Aneurysm     inter atrial septum  . Subdural hematoma     admitted 2007    Current Outpatient Prescriptions on File Prior to Visit  Medication Sig Dispense Refill  . calcium carbonate (OS-CAL) 600 MG TABS Take 600 mg by mouth 2 (two) times daily with a meal.      . diltiazem (DILACOR XR) 120 MG 24 hr capsule Take 1 capsule (120 mg total) by mouth daily.  30 capsule  5  . esomeprazole (NEXIUM) 40 MG capsule Take 1 capsule (40 mg total) by mouth daily at 12 noon.  30 capsule  5  . ferrous sulfate 325 (65 FE) MG tablet Take 1 tablet (325 mg total) by mouth 3 (three) times daily with meals.  90 tablet  3  . losartan (COZAAR) 50 MG tablet TAKE ONE (1) TABLET EACH DAY  30 tablet  5   No current facility-administered medications on file prior to visit.    Review of Systems Patient denies any headache, lightheadedness or dizziness.  No sinus or  allergy symptoms.   No chest pain or tightness. No increased heart rate or palpitations.  No increased shortness of breath now.  No cough or congestion.  No nausea or vomiting.  No acid reflux.   No abdominal pain or cramping.  No bowel change, such as diarrhea, constipation, BRBPR or melana now.  Some black stools from taking iron.   No urine change.  Overall she feels she is doing better and doing well.       Objective:   Physical Exam  Filed Vitals:   02/14/14 0838  BP: 120/70  Pulse: 88  Temp: 98 F (36.7 C)   Blood pressure recheck:  61/62  80 year old female in no acute distress.   HEENT:  Nares- clear.  Oropharynx - without lesions. NECK:  Supple.  Nontender.  No audible bruit.  HEART:  Appears to be regular. LUNGS:  No crackles or wheezing audible.  Respirations even and unlabored.  RADIAL PULSE:  Equal bilaterally.    BREASTS:  No nipple discharge or nipple retraction present.  Could not appreciate any distinct nodules or axillary adenopathy.  ABDOMEN:  Soft, nontender.  Bowel sounds present and normal.  No audible abdominal bruit.  GU:  Not performed.    EXTREMITIES:  No increased edema present.  DP pulses palpable and equal bilaterally.          Assessment & Plan:  DISPOSITION.  Living with her daugher.    HEALTH MAINTENANCE.  Physical today.   Mammogram 10/10/12 - Birads II.   Due f/u mammogram.  Schedule.    I spent 25 minutes with the pt and her daughter and more than 50% of the time was spent in consultation regarding the above.

## 2014-02-16 ENCOUNTER — Telehealth: Payer: Self-pay | Admitting: Internal Medicine

## 2014-02-16 ENCOUNTER — Encounter: Payer: Self-pay | Admitting: Internal Medicine

## 2014-02-16 DIAGNOSIS — Z1239 Encounter for other screening for malignant neoplasm of breast: Secondary | ICD-10-CM

## 2014-02-16 NOTE — Assessment & Plan Note (Signed)
Heart rate controlled.  On Diltiazem.  Sees Dr Fath.  Stable.   Follow.  Off xarelto given recent GI bleed.    

## 2014-02-16 NOTE — Assessment & Plan Note (Signed)
Recently admitted with GI bleed.  Off xarelto now.  Transfused one unit prbc's in the hospital.  Due f/u with GI.  Check cbc today.  Receiving B12 injections.  On iron.  Last hgb improved.

## 2014-02-16 NOTE — Assessment & Plan Note (Signed)
Recently admitted for GI bleed.  Evaluated by GI.  Elected to postpone further w/up.   Check cbc today.  On iron.

## 2014-02-16 NOTE — Assessment & Plan Note (Signed)
Continue vitamin D supplementation 

## 2014-02-16 NOTE — Assessment & Plan Note (Signed)
Blood pressure controlled.  On Losartan.  Follow.  Check met b.   

## 2014-02-16 NOTE — Assessment & Plan Note (Signed)
Will follow sodium to confirm stable.    

## 2014-02-16 NOTE — Assessment & Plan Note (Signed)
Off Xarelto secondary to GI bleed.  Seeing Dr Dew.  Follow.    

## 2014-02-16 NOTE — Assessment & Plan Note (Signed)
Initially diuresed with lasix.  Off now.  Had ECHO 11/19/13 that revealed EF 60-65%, mildly dilated left atrium and right atrium, mild mitral valve regurgitation and moderate tricuspid regurgitation.  Also had mildly elevated pulmonary artery pressure.  Doing better now.  Seeing Dr Fath.    

## 2014-02-16 NOTE — Telephone Encounter (Signed)
Mammogram ordered.  Needs to be scheduled at same time her daughter's - Clovia Cuff.  Need to talk to Izora Gala (daughter) about appt.  No Monday am.

## 2014-02-17 ENCOUNTER — Other Ambulatory Visit: Payer: Self-pay | Admitting: Internal Medicine

## 2014-02-17 DIAGNOSIS — D649 Anemia, unspecified: Secondary | ICD-10-CM

## 2014-02-17 NOTE — Progress Notes (Signed)
Order placed for f/u labs.  

## 2014-03-05 ENCOUNTER — Ambulatory Visit: Payer: Self-pay | Admitting: Internal Medicine

## 2014-03-05 DIAGNOSIS — Z1231 Encounter for screening mammogram for malignant neoplasm of breast: Secondary | ICD-10-CM | POA: Diagnosis not present

## 2014-03-05 LAB — HM MAMMOGRAPHY: HM MAMMO: NEGATIVE

## 2014-03-07 ENCOUNTER — Encounter: Payer: Self-pay | Admitting: Internal Medicine

## 2014-03-18 ENCOUNTER — Other Ambulatory Visit (INDEPENDENT_AMBULATORY_CARE_PROVIDER_SITE_OTHER): Payer: Medicare Other

## 2014-03-18 DIAGNOSIS — D649 Anemia, unspecified: Secondary | ICD-10-CM

## 2014-03-18 DIAGNOSIS — R319 Hematuria, unspecified: Secondary | ICD-10-CM

## 2014-03-18 LAB — CBC WITH DIFFERENTIAL/PLATELET
BASOS PCT: 0.4 % (ref 0.0–3.0)
Basophils Absolute: 0 10*3/uL (ref 0.0–0.1)
EOS PCT: 2.2 % (ref 0.0–5.0)
Eosinophils Absolute: 0.2 10*3/uL (ref 0.0–0.7)
HEMATOCRIT: 37.2 % (ref 36.0–46.0)
HEMOGLOBIN: 12.1 g/dL (ref 12.0–15.0)
LYMPHS ABS: 1.5 10*3/uL (ref 0.7–4.0)
Lymphocytes Relative: 21.8 % (ref 12.0–46.0)
MCHC: 32.7 g/dL (ref 30.0–36.0)
MCV: 94.7 fl (ref 78.0–100.0)
MONO ABS: 0.7 10*3/uL (ref 0.1–1.0)
MONOS PCT: 10.7 % (ref 3.0–12.0)
NEUTROS ABS: 4.5 10*3/uL (ref 1.4–7.7)
Neutrophils Relative %: 64.9 % (ref 43.0–77.0)
Platelets: 289 10*3/uL (ref 150.0–400.0)
RBC: 3.92 Mil/uL (ref 3.87–5.11)
RDW: 15.1 % — ABNORMAL HIGH (ref 11.5–14.6)
WBC: 6.9 10*3/uL (ref 4.5–10.5)

## 2014-03-18 LAB — FERRITIN: FERRITIN: 48.3 ng/mL (ref 10.0–291.0)

## 2014-03-20 ENCOUNTER — Encounter: Payer: Self-pay | Admitting: *Deleted

## 2014-03-20 ENCOUNTER — Other Ambulatory Visit: Payer: Self-pay | Admitting: Internal Medicine

## 2014-03-20 DIAGNOSIS — I509 Heart failure, unspecified: Secondary | ICD-10-CM

## 2014-03-20 DIAGNOSIS — R739 Hyperglycemia, unspecified: Secondary | ICD-10-CM

## 2014-03-20 DIAGNOSIS — I4891 Unspecified atrial fibrillation: Secondary | ICD-10-CM

## 2014-03-20 DIAGNOSIS — I10 Essential (primary) hypertension: Secondary | ICD-10-CM

## 2014-03-20 DIAGNOSIS — E871 Hypo-osmolality and hyponatremia: Secondary | ICD-10-CM

## 2014-03-20 DIAGNOSIS — D649 Anemia, unspecified: Secondary | ICD-10-CM

## 2014-03-20 LAB — URINALYSIS, ROUTINE W REFLEX MICROSCOPIC
Bilirubin Urine: NEGATIVE
HGB URINE DIPSTICK: NEGATIVE
KETONES UR: NEGATIVE
Nitrite: NEGATIVE
RBC / HPF: NONE SEEN (ref 0–?)
Specific Gravity, Urine: 1.01 (ref 1.000–1.030)
TOTAL PROTEIN, URINE-UPE24: NEGATIVE
URINE GLUCOSE: NEGATIVE
UROBILINOGEN UA: 0.2 (ref 0.0–1.0)
pH: 7 (ref 5.0–8.0)

## 2014-03-20 NOTE — Progress Notes (Signed)
Orders placed for f/u labs.  

## 2014-03-22 LAB — CULTURE, URINE COMPREHENSIVE: Colony Count: >=100000

## 2014-03-24 ENCOUNTER — Other Ambulatory Visit: Payer: Self-pay | Admitting: Internal Medicine

## 2014-03-24 ENCOUNTER — Other Ambulatory Visit: Payer: Self-pay | Admitting: *Deleted

## 2014-03-24 MED ORDER — CEPHALEXIN 500 MG PO CAPS: 500.0000 mg | ORAL_CAPSULE | Freq: Two times a day (BID) | ORAL | Status: DC

## 2014-04-17 DIAGNOSIS — D649 Anemia, unspecified: Secondary | ICD-10-CM | POA: Diagnosis not present

## 2014-04-17 DIAGNOSIS — I10 Essential (primary) hypertension: Secondary | ICD-10-CM | POA: Diagnosis not present

## 2014-04-17 DIAGNOSIS — I509 Heart failure, unspecified: Secondary | ICD-10-CM | POA: Diagnosis not present

## 2014-04-17 DIAGNOSIS — I4891 Unspecified atrial fibrillation: Secondary | ICD-10-CM | POA: Diagnosis not present

## 2014-04-18 DIAGNOSIS — I743 Embolism and thrombosis of arteries of the lower extremities: Secondary | ICD-10-CM | POA: Diagnosis not present

## 2014-04-18 DIAGNOSIS — I10 Essential (primary) hypertension: Secondary | ICD-10-CM | POA: Diagnosis not present

## 2014-04-18 DIAGNOSIS — M79609 Pain in unspecified limb: Secondary | ICD-10-CM | POA: Diagnosis not present

## 2014-04-18 DIAGNOSIS — E785 Hyperlipidemia, unspecified: Secondary | ICD-10-CM | POA: Diagnosis not present

## 2014-05-21 ENCOUNTER — Other Ambulatory Visit: Payer: Self-pay | Admitting: Internal Medicine

## 2014-06-13 ENCOUNTER — Other Ambulatory Visit (INDEPENDENT_AMBULATORY_CARE_PROVIDER_SITE_OTHER): Payer: Medicare Other

## 2014-06-13 DIAGNOSIS — R7309 Other abnormal glucose: Secondary | ICD-10-CM | POA: Diagnosis not present

## 2014-06-13 DIAGNOSIS — I509 Heart failure, unspecified: Secondary | ICD-10-CM

## 2014-06-13 DIAGNOSIS — I4891 Unspecified atrial fibrillation: Secondary | ICD-10-CM | POA: Diagnosis not present

## 2014-06-13 DIAGNOSIS — I10 Essential (primary) hypertension: Secondary | ICD-10-CM

## 2014-06-13 DIAGNOSIS — R739 Hyperglycemia, unspecified: Secondary | ICD-10-CM

## 2014-06-13 DIAGNOSIS — D649 Anemia, unspecified: Secondary | ICD-10-CM | POA: Diagnosis not present

## 2014-06-13 LAB — CBC WITH DIFFERENTIAL/PLATELET
BASOS PCT: 0.4 % (ref 0.0–3.0)
Basophils Absolute: 0 10*3/uL (ref 0.0–0.1)
EOS PCT: 2.2 % (ref 0.0–5.0)
Eosinophils Absolute: 0.2 10*3/uL (ref 0.0–0.7)
HEMATOCRIT: 38.7 % (ref 36.0–46.0)
Hemoglobin: 12.9 g/dL (ref 12.0–15.0)
LYMPHS ABS: 2 10*3/uL (ref 0.7–4.0)
Lymphocytes Relative: 24.7 % (ref 12.0–46.0)
MCHC: 33.5 g/dL (ref 30.0–36.0)
MCV: 96.5 fl (ref 78.0–100.0)
MONOS PCT: 9.9 % (ref 3.0–12.0)
Monocytes Absolute: 0.8 10*3/uL (ref 0.1–1.0)
Neutro Abs: 5 10*3/uL (ref 1.4–7.7)
Neutrophils Relative %: 62.8 % (ref 43.0–77.0)
PLATELETS: 317 10*3/uL (ref 150.0–400.0)
RBC: 4.01 Mil/uL (ref 3.87–5.11)
RDW: 13.8 % (ref 11.5–15.5)
WBC: 7.9 10*3/uL (ref 4.0–10.5)

## 2014-06-13 LAB — HEMOGLOBIN A1C: Hgb A1c MFr Bld: 5.5 % (ref 4.6–6.5)

## 2014-06-13 LAB — LIPID PANEL
CHOL/HDL RATIO: 4
Cholesterol: 190 mg/dL (ref 0–200)
HDL: 48 mg/dL (ref >39.00)
LDL CALC: 115 mg/dL — AB (ref 0–99)
NONHDL: 142
Triglycerides: 133 mg/dL (ref 0.0–149.0)
VLDL: 26.6 mg/dL (ref 0.0–40.0)

## 2014-06-13 LAB — COMPREHENSIVE METABOLIC PANEL
ALBUMIN: 3.7 g/dL (ref 3.5–5.2)
ALT: 16 U/L (ref 0–35)
AST: 18 U/L (ref 0–37)
Alkaline Phosphatase: 73 U/L (ref 39–117)
BUN: 20 mg/dL (ref 6–23)
CO2: 29 mEq/L (ref 19–32)
Calcium: 9.1 mg/dL (ref 8.4–10.5)
Chloride: 104 mEq/L (ref 96–112)
Creatinine, Ser: 1.2 mg/dL (ref 0.4–1.2)
GFR: 47.01 mL/min — ABNORMAL LOW (ref >60.00)
GLUCOSE: 89 mg/dL (ref 70–99)
POTASSIUM: 3.9 meq/L (ref 3.5–5.1)
SODIUM: 138 meq/L (ref 135–145)
TOTAL PROTEIN: 6.9 g/dL (ref 6.0–8.3)
Total Bilirubin: 0.7 mg/dL (ref 0.2–1.2)

## 2014-06-13 LAB — FERRITIN: Ferritin: 54.5 ng/mL (ref 10.0–291.0)

## 2014-06-13 LAB — TSH: TSH: 0.56 u[IU]/mL (ref 0.35–4.50)

## 2014-06-17 ENCOUNTER — Other Ambulatory Visit: Payer: Self-pay | Admitting: Internal Medicine

## 2014-06-18 ENCOUNTER — Ambulatory Visit (INDEPENDENT_AMBULATORY_CARE_PROVIDER_SITE_OTHER): Payer: Medicare Other | Admitting: Internal Medicine

## 2014-06-18 ENCOUNTER — Encounter: Payer: Self-pay | Admitting: Internal Medicine

## 2014-06-18 VITALS — BP 100/60 | HR 73 | Temp 97.7°F | Ht <= 58 in | Wt 96.2 lb

## 2014-06-18 DIAGNOSIS — M810 Age-related osteoporosis without current pathological fracture: Secondary | ICD-10-CM | POA: Diagnosis not present

## 2014-06-18 DIAGNOSIS — D649 Anemia, unspecified: Secondary | ICD-10-CM

## 2014-06-18 DIAGNOSIS — I743 Embolism and thrombosis of arteries of the lower extremities: Secondary | ICD-10-CM | POA: Diagnosis not present

## 2014-06-18 DIAGNOSIS — I10 Essential (primary) hypertension: Secondary | ICD-10-CM | POA: Diagnosis not present

## 2014-06-18 DIAGNOSIS — I509 Heart failure, unspecified: Secondary | ICD-10-CM

## 2014-06-18 DIAGNOSIS — E871 Hypo-osmolality and hyponatremia: Secondary | ICD-10-CM

## 2014-06-18 DIAGNOSIS — K922 Gastrointestinal hemorrhage, unspecified: Secondary | ICD-10-CM

## 2014-06-18 DIAGNOSIS — I4891 Unspecified atrial fibrillation: Secondary | ICD-10-CM

## 2014-06-18 NOTE — Progress Notes (Signed)
Pre visit review using our clinic review tool, if applicable. No additional management support is needed unless otherwise documented below in the visit note. 

## 2014-06-22 ENCOUNTER — Encounter: Payer: Self-pay | Admitting: Internal Medicine

## 2014-06-22 NOTE — Assessment & Plan Note (Signed)
Blood pressure controlled.  On Losartan.  Follow.  Check met b.   

## 2014-06-22 NOTE — Assessment & Plan Note (Signed)
Recently admitted for GI bleed.  Evaluated by GI.  Elected to postpone further w/up.   Follow cbc.  On iron.

## 2014-06-22 NOTE — Assessment & Plan Note (Signed)
Recently admitted with GI bleed.  Off xarelto now.  Transfused one unit prbc's in the hospital.  Continue to f/u with GI.  Check cbc today.  Receiving B12 injections.  On iron.  Last hgb improved.

## 2014-06-22 NOTE — Assessment & Plan Note (Signed)
Will follow sodium to confirm stable.

## 2014-06-22 NOTE — Assessment & Plan Note (Signed)
Continue vitamin D supplementation 

## 2014-06-22 NOTE — Assessment & Plan Note (Signed)
Off Xarelto secondary to GI bleed.  Seeing Dr Lucky Cowboy.  Follow.

## 2014-06-22 NOTE — Assessment & Plan Note (Signed)
Initially diuresed with lasix.  Off now.  Had ECHO 11/19/13 that revealed EF 60-65%, mildly dilated left atrium and right atrium, mild mitral valve regurgitation and moderate tricuspid regurgitation.  Also had mildly elevated pulmonary artery pressure.  Doing better now.  Seeing Dr Ubaldo Glassing.

## 2014-06-22 NOTE — Progress Notes (Signed)
  Subjective:    Patient ID: Carol Vega, female    DOB: September 03, 1921, 80 y.o.   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  She comes in today accompanied by her daughter - for a scheduled follow up.   History obtained from both of them.  Was admitted 11/16/13 - 11/19/13 for worsening sob with GI bleed.  Hgb on admission was noted to be 6.7.  Her xarelto and alleve were stopped.  She was transfused one unit of prbc's.  Xray with pleural effusions.  Was initially placed on lasix.  Off now.  Saw Dr Ubaldo Glassing in follow up.  Has been doing better.   No chest pain or tightness.  No increased palpitations or fluttering.  No sob.   Eating and drinking well.   No bowel problems.   Overall she feels she is doing well.  Hgb has improved.     Past Medical History  Diagnosis Date  . Osteoporosis   . Diverticulosis   . CHF (congestive heart failure)     h/o pleural effusions  . Atrial fibrillation   . Hypertension   . Aneurysm     inter atrial septum  . Subdural hematoma     admitted 2007    Current Outpatient Prescriptions on File Prior to Visit  Medication Sig Dispense Refill  . diltiazem (DILACOR XR) 120 MG 24 hr capsule TAKE ONE CAPSULE BY MOUTH DAILY  30 capsule  5  . losartan (COZAAR) 50 MG tablet TAKE ONE (1) TABLET BY MOUTH EVERY DAY  30 tablet  3  . NEXIUM 40 MG capsule TAKE ONE CAPSULE BY MOUTH DAILY AT 12 NOON  30 capsule  5   No current facility-administered medications on file prior to visit.    Review of Systems Patient denies any headache, lightheadedness or dizziness.  No sinus or allergy symptoms.   No chest pain or tightness. No increased heart rate or palpitations.  No increased shortness of breath now.  No cough or congestion.  No nausea or vomiting.  No acid reflux.   No abdominal pain or cramping.  No bowel change, such as diarrhea, constipation, BRBPR or melana.   No urine  change.  Overall she feels she is doing better and doing well.       Objective:   Physical Exam  Filed Vitals:   06/18/14 0920  BP: 100/60  Pulse: 73  Temp: 97.7 F (36.5 C)   Blood pressure recheck:  15/64  80 year old female in no acute distress.   HEENT:  Nares- clear.  Oropharynx - without lesions. NECK:  Supple.  Nontender.  No audible bruit.  HEART:  Appears to be regular. LUNGS:  No crackles or wheezing audible.  Respirations even and unlabored.  RADIAL PULSE:  Equal bilaterally.   ABDOMEN:  Soft, nontender.  Bowel sounds present and normal.  No audible abdominal bruit.    EXTREMITIES:  No increased edema present.  DP pulses palpable and equal bilaterally.          Assessment & Plan:  DISPOSITION.  Living with her daugher.    HEALTH MAINTENANCE.  Physical 02/14/14.   Mammogram 03/05/14- Birads I.

## 2014-06-22 NOTE — Assessment & Plan Note (Signed)
Heart rate controlled.  On Diltiazem.  Sees Dr Ubaldo Glassing.  Stable.   Follow.  Off xarelto given recent GI bleed.

## 2014-09-23 ENCOUNTER — Other Ambulatory Visit: Payer: Self-pay | Admitting: Internal Medicine

## 2014-10-01 DIAGNOSIS — I482 Chronic atrial fibrillation: Secondary | ICD-10-CM | POA: Diagnosis not present

## 2014-10-01 DIAGNOSIS — I5022 Chronic systolic (congestive) heart failure: Secondary | ICD-10-CM | POA: Diagnosis not present

## 2014-10-01 DIAGNOSIS — I10 Essential (primary) hypertension: Secondary | ICD-10-CM | POA: Diagnosis not present

## 2014-10-14 ENCOUNTER — Other Ambulatory Visit (INDEPENDENT_AMBULATORY_CARE_PROVIDER_SITE_OTHER): Payer: Medicare Other

## 2014-10-14 DIAGNOSIS — I509 Heart failure, unspecified: Secondary | ICD-10-CM

## 2014-10-14 DIAGNOSIS — D649 Anemia, unspecified: Secondary | ICD-10-CM | POA: Diagnosis not present

## 2014-10-14 DIAGNOSIS — I10 Essential (primary) hypertension: Secondary | ICD-10-CM | POA: Diagnosis not present

## 2014-10-14 LAB — COMPREHENSIVE METABOLIC PANEL
ALT: 11 U/L (ref 0–35)
AST: 20 U/L (ref 0–37)
Albumin: 3.8 g/dL (ref 3.5–5.2)
Alkaline Phosphatase: 58 U/L (ref 39–117)
BILIRUBIN TOTAL: 0.9 mg/dL (ref 0.2–1.2)
BUN: 17 mg/dL (ref 6–23)
CO2: 24 mEq/L (ref 19–32)
Calcium: 9 mg/dL (ref 8.4–10.5)
Chloride: 104 mEq/L (ref 96–112)
Creatinine, Ser: 1.1 mg/dL (ref 0.4–1.2)
GFR: 52.18 mL/min — ABNORMAL LOW (ref >60.00)
GLUCOSE: 93 mg/dL (ref 70–99)
Potassium: 4.2 mEq/L (ref 3.5–5.1)
Sodium: 139 mEq/L (ref 135–145)
Total Protein: 6.6 g/dL (ref 6.0–8.3)

## 2014-10-14 LAB — CBC WITH DIFFERENTIAL/PLATELET
Basophils Absolute: 0 10*3/uL (ref 0.0–0.1)
Basophils Relative: 0.5 % (ref 0.0–3.0)
EOS PCT: 2.2 % (ref 0.0–5.0)
Eosinophils Absolute: 0.2 10*3/uL (ref 0.0–0.7)
HEMATOCRIT: 39.1 % (ref 36.0–46.0)
Hemoglobin: 12.8 g/dL (ref 12.0–15.0)
Lymphocytes Relative: 24.8 % (ref 12.0–46.0)
Lymphs Abs: 1.9 10*3/uL (ref 0.7–4.0)
MCHC: 32.8 g/dL (ref 30.0–36.0)
MCV: 97.8 fl (ref 78.0–100.0)
MONOS PCT: 8.3 % (ref 3.0–12.0)
Monocytes Absolute: 0.6 10*3/uL (ref 0.1–1.0)
Neutro Abs: 5 10*3/uL (ref 1.4–7.7)
Neutrophils Relative %: 64.2 % (ref 43.0–77.0)
PLATELETS: 292 10*3/uL (ref 150.0–400.0)
RBC: 4 Mil/uL (ref 3.87–5.11)
RDW: 13.7 % (ref 11.5–15.5)
WBC: 7.8 10*3/uL (ref 4.0–10.5)

## 2014-10-14 LAB — LIPID PANEL
Cholesterol: 181 mg/dL (ref 0–200)
HDL: 49.5 mg/dL (ref >39.00)
LDL CALC: 112 mg/dL — AB (ref 0–99)
NonHDL: 131.5
Total CHOL/HDL Ratio: 4
Triglycerides: 98 mg/dL (ref 0.0–149.0)
VLDL: 19.6 mg/dL (ref 0.0–40.0)

## 2014-10-14 LAB — FERRITIN: FERRITIN: 86.9 ng/mL (ref 10.0–291.0)

## 2014-10-20 ENCOUNTER — Ambulatory Visit (INDEPENDENT_AMBULATORY_CARE_PROVIDER_SITE_OTHER): Payer: Medicare Other | Admitting: *Deleted

## 2014-10-20 ENCOUNTER — Ambulatory Visit (INDEPENDENT_AMBULATORY_CARE_PROVIDER_SITE_OTHER): Payer: Medicare Other | Admitting: Internal Medicine

## 2014-10-20 ENCOUNTER — Encounter: Payer: Self-pay | Admitting: Internal Medicine

## 2014-10-20 VITALS — BP 110/70 | HR 76 | Temp 97.6°F | Ht <= 58 in | Wt 101.5 lb

## 2014-10-20 DIAGNOSIS — D649 Anemia, unspecified: Secondary | ICD-10-CM | POA: Diagnosis not present

## 2014-10-20 DIAGNOSIS — I4891 Unspecified atrial fibrillation: Secondary | ICD-10-CM

## 2014-10-20 DIAGNOSIS — M810 Age-related osteoporosis without current pathological fracture: Secondary | ICD-10-CM | POA: Diagnosis not present

## 2014-10-20 DIAGNOSIS — K922 Gastrointestinal hemorrhage, unspecified: Secondary | ICD-10-CM

## 2014-10-20 DIAGNOSIS — Z23 Encounter for immunization: Secondary | ICD-10-CM | POA: Diagnosis not present

## 2014-10-20 DIAGNOSIS — E871 Hypo-osmolality and hyponatremia: Secondary | ICD-10-CM

## 2014-10-20 DIAGNOSIS — I10 Essential (primary) hypertension: Secondary | ICD-10-CM | POA: Diagnosis not present

## 2014-10-20 DIAGNOSIS — E7800 Pure hypercholesterolemia, unspecified: Secondary | ICD-10-CM

## 2014-10-20 DIAGNOSIS — I743 Embolism and thrombosis of arteries of the lower extremities: Secondary | ICD-10-CM | POA: Diagnosis not present

## 2014-10-20 NOTE — Progress Notes (Addendum)
Subjective:    Patient ID: Carol Vega, female    DOB: Jul 12, 1921, 80 y.o.   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  She comes in today accompanied by her daughter - for a scheduled follow up.   History obtained from both of them.  Was admitted 11/16/13 - 11/19/13 for worsening sob with GI bleed.  Hgb on admission was noted to be 6.7.  Her xarelto and alleve were stopped.  She was transfused one unit of prbc's.  Xray with pleural effusions.  Was initially placed on lasix.  Off now.  Saw Dr Ubaldo Glassing in follow up.  Has been doing better.   No chest pain or tightness.  No increased palpitations or fluttering.  No sob.   Eating and drinking well.   No bowel problems.   Overall she feels she is doing well.  Hgb has improved.     Past Medical History  Diagnosis Date  . Osteoporosis   . Diverticulosis   . CHF (congestive heart failure)     h/o pleural effusions  . Atrial fibrillation   . Hypertension   . Aneurysm     inter atrial septum  . Subdural hematoma     admitted 2007    Current Outpatient Prescriptions on File Prior to Visit  Medication Sig Dispense Refill  . DILT-XR 120 MG 24 hr capsule TAKE ONE CAPSULE BY MOUTH DAILY 30 capsule 3  . ferrous sulfate 325 (65 FE) MG tablet Take 325 mg by mouth daily.    Marland Kitchen losartan (COZAAR) 50 MG tablet TAKE ONE (1) TABLET BY MOUTH EVERY DAY 30 tablet 3  . NEXIUM 40 MG capsule TAKE ONE CAPSULE BY MOUTH DAILY AT 12 NOON 30 capsule 5   No current facility-administered medications on file prior to visit.    Review of Systems Patient denies any headache, lightheadedness or dizziness.  No sinus or allergy symptoms.   No chest pain or tightness. No increased heart rate or palpitations.  No increased shortness of breath now.  No cough or congestion.  No nausea or vomiting.  No acid reflux.   No abdominal pain or cramping.  No bowel change, such as  diarrhea, constipation, BRBPR or melana.   No urine change.  Overall she feels she is doing better and doing well.       Objective:   Physical Exam  Filed Vitals:   10/20/14 1035  BP: 110/70  Pulse: 76  Temp: 97.6 F (36.4 C)   Blood pressure recheck:   80 year old female in no acute distress.   HEENT:  Nares- clear.  Oropharynx - without lesions. NECK:  Supple.  Nontender.  No audible bruit.  HEART:  Appears to be regular. LUNGS:  No crackles or wheezing audible.  Respirations even and unlabored.  RADIAL PULSE:  Equal bilaterally.   ABDOMEN:  Soft, nontender.  Bowel sounds present and normal.  No audible abdominal bruit.    EXTREMITIES:  No increased edema present.  DP pulses palpable and equal bilaterally.          Assessment & Plan:  1. Atrial fibrillation, unspecified On aspirin.  Had bleeding with other blood thinners.  Doing well.  Seeing cardiology.  Currently asymptomatic.    2. Essential hypertension Blood pressure doing well.  Follow.  Same medication regimen.  - Comprehensive metabolic panel; Future  3. Osteoporosis Continue vitamin D Supplementation.  4. Anemia, unspecified anemia type Last hgb wnl.  Iron stores improved.  Will stop iron and see if maintains hgb.   - CBC with Differential; Future - Ferritin; Future  5. Hyponatremia Sodium just check and wnl.   - Comprehensive metabolic panel; Future  6. Hypercholesteremia Low cholesterol diet and exercise.   - Lipid panel; Future Lab Results  Component Value Date   CHOL 181 10/14/2014   HDL 49.50 10/14/2014   LDLCALC 112* 10/14/2014   TRIG 98.0 10/14/2014   CHOLHDL 4 10/14/2014   7. Gastrointestinal hemorrhage, unspecified gastritis, unspecified gastrointestinal hemorrhage type No further bleeding.    OXYGEN USAGE.  Still requires the oxygen intermittently.  Still needs the oxygen prn at home with exertion.  Overall breathing stable, but some sob with exertion at times.  Daughter discussed with  Dr Ubaldo Glassing.  Recommended continuing oxygen prn as outlined.    DISPOSITION.  Living with her daugher.    HEALTH MAINTENANCE.  Physical 02/14/14.   Mammogram 03/05/14- Birads I.

## 2014-10-20 NOTE — Progress Notes (Signed)
Pre visit review using our clinic review tool, if applicable. No additional management support is needed unless otherwise documented below in the visit note. 

## 2014-10-21 ENCOUNTER — Encounter: Payer: Self-pay | Admitting: *Deleted

## 2014-10-21 ENCOUNTER — Encounter: Payer: Self-pay | Admitting: Internal Medicine

## 2014-10-21 ENCOUNTER — Telehealth: Payer: Self-pay | Admitting: Internal Medicine

## 2014-10-21 NOTE — Telephone Encounter (Signed)
Please notify pts daughter to stop Carol Vega iron supplements.  Want to stop given recent hgb and ferritin wnl.  Will follow hgb.

## 2014-10-21 NOTE — Telephone Encounter (Signed)
Letter mailed

## 2014-10-22 ENCOUNTER — Other Ambulatory Visit: Payer: Self-pay | Admitting: Internal Medicine

## 2014-10-29 ENCOUNTER — Other Ambulatory Visit: Payer: Self-pay | Admitting: Internal Medicine

## 2014-11-26 ENCOUNTER — Other Ambulatory Visit: Payer: Self-pay | Admitting: Internal Medicine

## 2015-01-07 DIAGNOSIS — H3531 Nonexudative age-related macular degeneration: Secondary | ICD-10-CM | POA: Diagnosis not present

## 2015-01-22 ENCOUNTER — Encounter: Payer: Self-pay | Admitting: Internal Medicine

## 2015-01-22 ENCOUNTER — Ambulatory Visit (INDEPENDENT_AMBULATORY_CARE_PROVIDER_SITE_OTHER): Payer: Medicare Other | Admitting: Internal Medicine

## 2015-01-22 VITALS — BP 120/80 | HR 81 | Temp 98.0°F | Ht <= 58 in | Wt 94.2 lb

## 2015-01-22 DIAGNOSIS — E7800 Pure hypercholesterolemia, unspecified: Secondary | ICD-10-CM

## 2015-01-22 DIAGNOSIS — I10 Essential (primary) hypertension: Secondary | ICD-10-CM

## 2015-01-22 DIAGNOSIS — I743 Embolism and thrombosis of arteries of the lower extremities: Secondary | ICD-10-CM | POA: Diagnosis not present

## 2015-01-22 DIAGNOSIS — E871 Hypo-osmolality and hyponatremia: Secondary | ICD-10-CM

## 2015-01-22 DIAGNOSIS — Z0000 Encounter for general adult medical examination without abnormal findings: Secondary | ICD-10-CM

## 2015-01-22 DIAGNOSIS — I4891 Unspecified atrial fibrillation: Secondary | ICD-10-CM | POA: Diagnosis not present

## 2015-01-22 DIAGNOSIS — D649 Anemia, unspecified: Secondary | ICD-10-CM

## 2015-01-22 NOTE — Progress Notes (Signed)
Patient ID: Carol Vega, female   DOB: 1920-12-02, 81 y.o.   MRN: [id number]   Subjective:    Patient ID: [name], female    DOB: 06-05-1921, 81 y.o.   MRN: [id number]  HPI  Patient here for a scheduled follow up.  She is accompanied by her daughter.  History obtained from both of them.  States she is doing well.  Recently had a problem with her tooth.  Had to eat a liquid diet for three weeks.  Weight is down.  They feel related to the change in diet and her tooth problem.  Appetite is better.  Eating better.  No vomiting or nausea.  Bowels stable.  No increased heart rate or palpitations.  Breathing stable.  Diagnosed with early stage of macular degeneration.  On preservision.     Past Medical History  Diagnosis Date  . Osteoporosis   . Diverticulosis   . CHF (congestive heart failure)     h/o pleural effusions  . Atrial fibrillation   . Hypertension   . Aneurysm     inter atrial septum  . Subdural hematoma     admitted 2007    Current Outpatient Prescriptions on File Prior to Visit  Medication Sig Dispense Refill  . Ascorbic Acid (VITAMIN C PO) Take by mouth.    Marland Kitchen CALCIUM-MAGNESIUM PO Take by mouth daily.    Marland Kitchen DILT-XR 120 MG 24 hr capsule TAKE ONE CAPSULE BY MOUTH DAILY 30 capsule 3  . ferrous sulfate 325 (65 FE) MG tablet Take 325 mg by mouth daily.    . ferrous sulfate 325 (65 FE) MG tablet TAKE ONE TABLET BY MOUTH THREE TIMES A DAY WITH FOOD. 90 tablet 1  . losartan (COZAAR) 50 MG tablet TAKE ONE (1) TABLET BY MOUTH EVERY DAY 30 tablet 6  . NEXIUM 40 MG capsule TAKE 1 CAPSULE EVERY DAY AT 12 NOON 30 capsule 5   No current facility-administered medications on file prior to visit.    Review of Systems  Constitutional: Negative for appetite change (no change now.  eating better since tooth repaired.  ) and unexpected weight change.  HENT: Negative for congestion and sinus pressure.   Respiratory: Negative for cough, chest tightness and shortness of breath.     Cardiovascular: Negative for chest pain, palpitations and leg swelling.  Gastrointestinal: Negative for nausea, vomiting, abdominal pain and diarrhea.  Genitourinary: Negative for dysuria and difficulty urinating.  Musculoskeletal: Negative for back pain and joint swelling.  Neurological: Negative for dizziness, light-headedness and headaches.       Objective:     Blood pressure recheck:  126/80, pulse 92  Physical Exam  Constitutional: She appears well-developed and well-nourished. No distress.  HENT:  Nose: Nose normal.  Mouth/Throat: Oropharynx is clear and moist.  Neck: Neck supple. No thyromegaly present.  Cardiovascular: Normal rate and regular rhythm.   Pulmonary/Chest: Breath sounds normal. No respiratory distress. She has no wheezes.  Abdominal: Soft. Bowel sounds are normal. There is no tenderness.  Musculoskeletal: She exhibits no edema or tenderness.  Lymphadenopathy:    She has no cervical adenopathy.  Skin: No rash noted. No erythema.    BP 120/80 mmHg  Pulse 81  Temp(Src) 98 F (36.7 C) (Oral)  Ht 4\' 10"  (1.473 m)  Wt 94 lb 4 oz (42.752 kg)  BMI 19.70 kg/m2  SpO2 99% Wt Readings from Last 3 Encounters:  01/22/15 94 lb 4 oz (42.752 kg)  10/20/14 101 lb 8 oz (46.04 kg)  06/18/14 96 lb 4 oz (43.659 kg)     Lab Results  Component Value Date   WBC 9.1 01/22/2015   HGB 13.0 01/22/2015   HCT 38.5 01/22/2015   PLT 299.0 01/22/2015   GLUCOSE 155* 01/22/2015   CHOL 181 10/14/2014   TRIG 98.0 10/14/2014   HDL 49.50 10/14/2014   LDLCALC 112* 10/14/2014   ALT 12 01/22/2015   AST 16 01/22/2015   NA 136 01/22/2015   K 4.2 01/22/2015   CL 101 01/22/2015   CREATININE 1.14 01/22/2015   BUN 18 01/22/2015   CO2 29 01/22/2015   TSH 0.56 06/13/2014   HGBA1C 5.5 06/13/2014        Assessment & Plan:   Problem List Items Addressed This Visit    Anemia    Was previously admitted with GI bleed.  Receiving b12 injections.  On iron.  Follow cbc.         Arterial embolus and thrombosis of lower extremity    Off xarelto secondary to GI bleed.  Seeing Dr Lucky Cowboy.  Follow.        Atrial fibrillation - Primary    Heart rate controlled.  On diltiazem.  Sees Dr Ubaldo Glassing.  Stable.  Follow.        Health care maintenance    Physical 02/14/14.  Schedule physical next visit.  Mammogram 03/05/14 - Birads I.        Hypertension    Blood pressure under good control.  Follow pressures.  Follow metabolic panel.        Hyponatremia    Follow sodium to confirm stable.         Other Visit Diagnoses    Hypercholesteremia            Einar Pheasant, MD

## 2015-01-22 NOTE — Progress Notes (Signed)
Pre visit review using our clinic review tool, if applicable. No additional management support is needed unless otherwise documented below in the visit note. 

## 2015-01-23 LAB — CBC WITH DIFFERENTIAL/PLATELET
Basophils Absolute: 0 10*3/uL (ref 0.0–0.1)
Basophils Relative: 0.3 % (ref 0.0–3.0)
EOS ABS: 0.1 10*3/uL (ref 0.0–0.7)
Eosinophils Relative: 1.3 % (ref 0.0–5.0)
HCT: 38.5 % (ref 36.0–46.0)
Hemoglobin: 13 g/dL (ref 12.0–15.0)
Lymphocytes Relative: 15.9 % (ref 12.0–46.0)
Lymphs Abs: 1.5 10*3/uL (ref 0.7–4.0)
MCHC: 33.8 g/dL (ref 30.0–36.0)
MCV: 94.9 fl (ref 78.0–100.0)
MONO ABS: 0.6 10*3/uL (ref 0.1–1.0)
Monocytes Relative: 6.8 % (ref 3.0–12.0)
Neutro Abs: 6.9 10*3/uL (ref 1.4–7.7)
Neutrophils Relative %: 75.7 % (ref 43.0–77.0)
Platelets: 299 10*3/uL (ref 150.0–400.0)
RBC: 4.06 Mil/uL (ref 3.87–5.11)
RDW: 14.1 % (ref 11.5–15.5)
WBC: 9.1 10*3/uL (ref 4.0–10.5)

## 2015-01-23 LAB — COMPREHENSIVE METABOLIC PANEL
ALBUMIN: 3.9 g/dL (ref 3.5–5.2)
ALT: 12 U/L (ref 0–35)
AST: 16 U/L (ref 0–37)
Alkaline Phosphatase: 74 U/L (ref 39–117)
BUN: 18 mg/dL (ref 6–23)
CO2: 29 mEq/L (ref 19–32)
Calcium: 9.2 mg/dL (ref 8.4–10.5)
Chloride: 101 mEq/L (ref 96–112)
Creatinine, Ser: 1.14 mg/dL (ref 0.40–1.20)
GFR: 47.42 mL/min — AB (ref >60.00)
GLUCOSE: 155 mg/dL — AB (ref 70–99)
POTASSIUM: 4.2 meq/L (ref 3.5–5.1)
Sodium: 136 mEq/L (ref 135–145)
TOTAL PROTEIN: 6.9 g/dL (ref 6.0–8.3)
Total Bilirubin: 0.5 mg/dL (ref 0.2–1.2)

## 2015-01-23 LAB — FERRITIN: FERRITIN: 108 ng/mL (ref 10.0–291.0)

## 2015-01-25 ENCOUNTER — Other Ambulatory Visit: Payer: Self-pay | Admitting: Internal Medicine

## 2015-01-25 DIAGNOSIS — R739 Hyperglycemia, unspecified: Secondary | ICD-10-CM

## 2015-01-25 DIAGNOSIS — N289 Disorder of kidney and ureter, unspecified: Secondary | ICD-10-CM

## 2015-01-25 NOTE — Progress Notes (Signed)
Order placed for f/u labs.  

## 2015-01-26 ENCOUNTER — Encounter: Payer: Self-pay | Admitting: Internal Medicine

## 2015-01-26 DIAGNOSIS — Z0000 Encounter for general adult medical examination without abnormal findings: Secondary | ICD-10-CM | POA: Insufficient documentation

## 2015-01-26 NOTE — Assessment & Plan Note (Signed)
Heart rate controlled.  On diltiazem.  Sees Dr Ubaldo Glassing.  Stable.  Follow.

## 2015-01-26 NOTE — Assessment & Plan Note (Signed)
Blood pressure under good control.  Follow pressures.  Follow metabolic panel.   

## 2015-01-26 NOTE — Assessment & Plan Note (Signed)
Off xarelto secondary to GI bleed.  Seeing Dr Lucky Cowboy.  Follow.

## 2015-01-26 NOTE — Assessment & Plan Note (Signed)
Physical 02/14/14.  Schedule physical next visit.  Mammogram 03/05/14 - Birads I.

## 2015-01-26 NOTE — Assessment & Plan Note (Signed)
Was previously admitted with GI bleed.  Receiving b12 injections.  On iron.  Follow cbc.

## 2015-01-26 NOTE — Assessment & Plan Note (Signed)
Follow sodium to confirm stable.  

## 2015-01-27 ENCOUNTER — Other Ambulatory Visit: Payer: Self-pay | Admitting: Internal Medicine

## 2015-01-30 ENCOUNTER — Ambulatory Visit (INDEPENDENT_AMBULATORY_CARE_PROVIDER_SITE_OTHER): Payer: Medicare Other | Admitting: Podiatry

## 2015-01-30 ENCOUNTER — Encounter: Payer: Self-pay | Admitting: Podiatry

## 2015-01-30 DIAGNOSIS — M79673 Pain in unspecified foot: Secondary | ICD-10-CM | POA: Diagnosis not present

## 2015-01-30 DIAGNOSIS — B351 Tinea unguium: Secondary | ICD-10-CM

## 2015-01-30 NOTE — Progress Notes (Signed)
Subjective:     Patient ID: Carol Vega, female   DOB: 1921/06/10, 81 y.o.   MRN: [id number]  HPI patient's found to have thick yellow brittle nailbeds 1-5 both feet that are painful in shoe gear Review of Systems     Objective:   Physical Exam Brittle yellow nailbeds 1-5 both feet that are painful when palpated from a dorsal direction    Assessment:     Mycotic nail infection 1-5 both feet    Plan:     Debride painful nailbeds 1-5 both feet with no iatrogenic bleeding noted

## 2015-02-10 ENCOUNTER — Other Ambulatory Visit: Payer: Self-pay | Admitting: *Deleted

## 2015-02-10 MED ORDER — LOSARTAN POTASSIUM 50 MG PO TABS: ORAL_TABLET | ORAL | Status: DC

## 2015-03-13 NOTE — Consult Note (Signed)
Pt CC is UGI bleed from Aleve and made worse by Xarelto.  The Xarelto was last taken on 12/26 so it is tapering off and in 24 hours will not be a factor.  Given her CXR, lung exam, SOB I recommend continued medical treatment and hold EGD unless she shows signs of active bleeding.  Given her hgb and BUN she likely has stopped bleeding.  Will give her water today and start clear liq tomorrow.  Would check bid HGB at least. Will follow with you.  Electronic Signatures: Manya Silvas (MD)  (Signed on 28-Dec-14 09:58)  Authored  Last Updated: 28-Dec-14 09:58 by Manya Silvas (MD)

## 2015-03-13 NOTE — Consult Note (Signed)
CC: UGI bleed, since Hgb stable and it appears bleeding stopped and she and family do not want EGD I will sign off, please reconsult it needed.  Would give bid PPI for 4-6 weeks and then one a day forever.  Electronic Signatures: Manya Silvas (MD)  (Signed on 29-Dec-14 19:11)  Authored  Last Updated: 29-Dec-14 19:11 by Manya Silvas (MD)

## 2015-03-14 NOTE — Discharge Summary (Signed)
PATIENT NAME:  Carol Vega, Carol Vega MR#:  [id number] DATE OF BIRTH:  May 16, 1921  DATE OF ADMISSION:  11/16/2013 DATE OF DISCHARGE:  11/19/2013  DICTATING AN ADDENDUM TO ALREADY DICTATED DISCHARGE SUMMARY   HISTORY OF PRESENTING ILLNESS: A 81 year old female, very pleasant, sharp and very active according to her age, walks without any support and living independent life, taking  Xarelto for A. fib and arthritis takes Advil. The last few days her arthritis pain felt worse and she was taking almost 2 to 3 times every day Aleve. She also noticed her stool was getting darker and there was some blood in that, but no abdominal pain or vomiting. She was started feeling increasingly worse and short of breath, so decided to come to the Emergency Room. With minimal exertion, she was getting short of breath. Her hemoglobin rose found to be 6.7 two months ago when she had a visit with primary care physician; as per the patient, everything was fine.   HOSPITAL COURSE AND STAY:  1.  She received 1 unit of blood transfusion and hemoglobin is stable after that.  GI consult was called in and they suggested also to stop Xarelto and continue proton pump inhibitor IV b.i.d. and as the patient's hemoglobin remained stable, most of the bleeding had stopped. They suggested to not to do any further work-up at this time and continue follow ups.  2.  Acute blood loss anemia. Hemoglobin was 6.7, transfused 1 unit of packed red blood cells and discharged on oral iron therapy.  3. Atrial fibrillation, was on rate controlling medication here, stopped Xarelto because of gastrointestinal bleed and discharged with the same.  4. Infiltrate versus effusion on chest x-ray. The patient was started on Levaquin to treat for possibility of pneumonia in setting of shortness of breath.  5. Setting of shortness of breath. X-ray was evident of cardiomegaly plus some kind of effusion also. We gave Lasix. Was waiting for echocardiogram, but because of  holiday season, was not able to get echocardiogram and so advised the patient to follow with cardiology office and get echocardiogram over there.  6. Hypertension, restarted metoprolol.  7. Arthritis pain and osteoporosis. Advised not to have too much pain medication at this time because of gastrointestinal bleed.   DATABASE: Hemoglobin at the time of discharge 8.4, platelet count 344. Echocardiogram was done later on and reported later. Ejection fraction 55%, normal global left pain with systolic function, decreased air range of motion,  internal cavity size, mild dilated left atrium, mild elevated right atrium.   Total time spent in this patient's discharge: 45 minutes.   ____________________________ Ceasar Lund Anselm Jungling, MD vgv:NTS D: 11/24/2013 00:40:15 ET T: 11/24/2013 04:48:46 ET JOB#: [id number]  cc: Ceasar Lund. Anselm Jungling, MD, <Dictator> Vaughan Basta MD ELECTRONICALLY SIGNED 11/27/2013 13:49

## 2015-03-14 NOTE — Discharge Summary (Signed)
PATIENT NAME:  Carol Vega, RAMAKRISHNAN MR#:  [id number] DATE OF BIRTH:  June 14, 1921  DATE OF ADMISSION:  11/16/2013 DATE OF DISCHARGE:  11/19/2013  DISCHARGE DIAGNOSES: 1.  Acute gastrointestinal bleed, possible ulcer disease.  2.  Congestive heart failure, needs further evaluation.  3.  Atrial fibrillation, was on Xarelto and stopped.  4.  Anemia of blood loss.  5.  Hypertension.  6.  Heart failure.   CONDITION ON DISCHARGE:  Stable.   CODE STATUS:  FULL CODE.   MEDICATIONS ON DISCHARGE: 1.  Diltiazem 120 mg 24-hour extended release once a day.  2.  Losartan 25 mg oral tablet once a day.  3.  Acetaminophen 325 mg oral tablet every six hours as needed for pain and temperature greater than 100.4.  4.  Pantoprazole 40 mg delayed-release tablet 2 times a day.  5.  Levaquin 750 mg every other day for five days.  6.  Furosemide 20 mg once a day.  7.  Ferrous sulfate 325 mg oral tablet 3 times a day.   Advised to have physical therapy and nurse at home and regular consistency diet.   Follow-up within 1 to 2 weeks with Dr. Ubaldo Glassing.  Advised to have cardiology clinic follow-up within 1 to 2 weeks with echocardiogram, needs to follow hemoglobin level with PMD in one month.   Total time spent on this discharge 40 minutes.    ____________________________ Ceasar Lund Anselm Jungling, MD vgv:ea D: 11/24/2013 00:05:57 ET T: 11/24/2013 03:06:01 ET JOB#: [id number]  cc: Ceasar Lund. Anselm Jungling, MD, <Dictator> Javier Docker. Ubaldo Glassing, MD Vaughan Basta MD ELECTRONICALLY SIGNED 11/27/2013 13:47

## 2015-03-14 NOTE — H&P (Signed)
PATIENT NAME:  Carol Vega, TOWELL MR#:  [id number] DATE OF BIRTH:  1921-01-22  DATE OF ADMISSION:  11/16/2013  PRIMARY CARE PHYSICIAN: At [facility]. In the past she was seeing Dr. Ginette Pitman, but changed it since 1 year.   PRIMARY CARDIOLOGIST: Dr. Ubaldo Glassing with [facility].  REFERRING EMERGENCY ROOM PHYSICIAN: Dr. Carrie Mew.   CHIEF COMPLAINT: Shortness of breath.   HISTORY OF PRESENT ILLNESS: This is a 81 year old female, very pleasant and sharp and very active according to her age. Walks without any support and living independent life, but lives with her daughter. Is taking Xarelto for her A. fib and for her arthritis, she takes Aleve on as-needed basis. As per daughter and patient, for the last few days her pain was worse and so she was taking Aleve almost 2 or 3 times every day. She also noticed the last few days that her stool is getting darker and there was some blood in that, but no abdominal pain and no vomiting. The last few days, she also started with increasingly worse shortness of breath and very weak, to the level that even while sitting in the recliner she is short of breath, and so decided to come to the Emergency Room. In ER, she was found having hemoglobin 6.7, and so hospitalist service is being contacted for further evaluation.   REVIEW OF SYSTEMS:    CONSTITUTIONAL: Negative for fever, but positive for fatigue and generalized weakness. No pain or weight loss.  EYES: No blurring, double vision, discharge or redness.  EARS, NOSE, THROAT: No tinnitus, ear pain or hearing loss.  RESPIRATORY: No cough, wheezing, hemoptysis CARDIOVASCULAR: No chest pain, orthopnea, edema or palpitations.  GASTROINTESTINAL: No nausea, vomiting, diarrhea or abdominal pain, but there is some black-colored stool present. No diarrhea or constipation.  GENITOURINARY: No dysuria, hematuria or increased frequency.  ENDOCRINE: No increased sweating or heat or cold intolerance.  SKIN: No rashes or acne or  lesions.  MUSCULOSKELETAL: No swelling or pain in the joints.  NEUROLOGICAL: No tremors. No focal weakness.  PSYCHIATRIC: Denies any insomnia, bipolar disorder or depression.   PAST MEDICAL HISTORY: 1.  Atrial fibrillation.  2.  Hypertension.  3.  Osteoporosis and osteoarthritis.  4.  Diverticulosis.  5.  Congestive heart failure with history of pleural effusion.  6.  Deep vein thrombosis 2 years ago, which was resolved on further followup.   PAST SURGICAL HISTORY:  1.  Benign breast biopsy.  2.  Subdural hematoma in 2007.   FAMILY HISTORY: Father had colon cancer. Daughter has diabetes.   HOME MEDICATIONS: 1.  Diltiazem extended-release 120 mg once a day.  2.  Losartan 25 mg once a day.  3.  Rivaroxaban 15 mg 2 times a day.  4.  Aleve for pain relief, as-needed basis.   SOCIAL HISTORY: Lives with her daughter. Denies any smoking, drinking alcohol, or illegal drug use. She is very independent in day-to-day activities. Does not need any support and mentally very sharp, remembers all the details.  PHYSICAL EXAMINATION:   VITAL SIGNS: In ER, temperature 97.7, pulse 66 and A. fib, respiration 20, blood pressure 120/58,  pulse oximetry 98 on oxygen supplementation (it was 81 as noticed earlier by nurse in ER on room air).  GENERAL: The patient is fully alert and oriented to time, place, and person. Does not appear in any acute distress.  HEENT: Head and neck atraumatic. Conjunctivae pale. Oral mucosa moist.  NECK: Supple. No JVD.  RESPIRATORY: Bilateral clear and equal air entry.  CARDIOVASCULAR: S1, S2 present, irregular. No murmur.  ABDOMEN: Soft, nontender. Bowel sounds present. No organomegaly.  SKIN: No rashes.  EXTREMITIES: Legs: No edema.  NEUROLOGICAL: Power 5/5. Moves all 4 limbs. No Flahive abnormality.  MUSCULOSKELETAL: Joints: No swelling or tenderness.  PSYCHIATRIC: Does not appear in any acute psychiatric illness at this time.   IMPORTANT LABORATORY AND RADIOLOGICAL  RESULTS: Glucose 169, BUN 32, creatinine 1.34, potassium 4.2, sodium 133, chloride 104, CO2 of 23. Troponin less than 0.02. WBC 7.9, hemoglobin 6.7, platelet count 355, MCV 95.   Chest x-ray, PA and lateral: Bilateral pleural effusion and basilar atelectasis versus airspace disease.   ASSESSMENT AND PLAN: A 81 year old female who is functionally very active and taking Xarelto for atrial fibrillation and Aleve for pain management on arthritis. Came to the Emergency Room with feeling excessively short of breath for the last few days and noticed black-colored stool.  1.  Gastrointestinal bleed: Most likely it is upper gastrointestinal bleed due to ulcer secondary to pain medication use and anticoagulation. Last Xarelto use was yesterday evening. We will keep her n.p.o. and give her Protonix IV b.i.d. We will hold all anticoagulation at this time, and blood transfusion already ordered by ER physician. Will follow hemoglobin every 8 hours and transfuse if it drops again below 7.  2.  Atrial fibrillation: Currently the rate is controlled, so we will not give any medication. We will hold anticoagulation because of gastrointestinal bleed, and she might not be a candidate for any future anticoagulation secondary to this gastrointestinal bleed issue, but we will address this issue later on.  3.  Hypertension: Currently blood pressure is stable and in view of acute gastrointestinal bleed, we will hold all blood pressure medication at this time.  4.  Arthritis pain and osteoporosis: We will hold all pain medication at this time because of gastrointestinal bleed.  5.  Deep vein thrombosis prophylaxis with sequential compressive devices. No anticoagulation because of gastrointestinal bleed.  6.  Plan discussed with the patient and her daughter, who is present in the room.   TOTAL TIME SPENT: 60 minutes in this admission.   ____________________________ Ceasar Lund Anselm Jungling, MD vgv:jcm D: 11/16/2013 17:49:12  ET T: 11/16/2013 18:35:21 ET JOB#: [id number]  cc: Ceasar Lund. Anselm Jungling, MD, <Dictator> Einar Pheasant, MD Javier Docker. Ubaldo Glassing, MD Vaughan Basta MD ELECTRONICALLY SIGNED 11/24/2013 22:06

## 2015-03-14 NOTE — Consult Note (Signed)
PATIENT NAME:  Carol Vega, Carol Vega MR#:  [id number] DATE OF BIRTH:  [birth year]  DATE OF CONSULTATION:  11/17/2013  CONSULTING PHYSICIAN:  Manya Silvas, MD  The patient is a 81 year old white female who presented with rectal bleeding with stools getting dark and some blood in it. She was taking Xarelto for atrial fibrillation and taking Aleve. She had a flare-up in her arthritis and was taking Aleve 2 to 3 times a day, and for the last few days the stools were getting darker and she is getting increasingly short of breath and weak, even at rest in recliner she was short of breath. Came to the ER and was found to have a hemoglobin 6.7 and was admitted to the hospital and GI was consulted because of her likely gastrointestinal bleeding.   The patient is awake, alert, oriented. She answers the questions appropriately.   REVIEW OF SYSTEMS: No nausea, no vomiting. No abdominal pain. No chest pains. She does complain of some shortness of breath and has dyspnea with exertion. No productive cough.  No vomiting. No hematemesis. She did have dark-colored stools. No dysuria. No hematuria. No disorientation.   PAST MEDICAL HISTORY: 1. Atrial fibrillation.  2. Hypertension.  3. Diverticulosis.  4. Congestive heart failure with history of pleural effusions.  5. Deep vein thromboses two years ago.   PAST SURGICAL HISTORY: Subdural hematoma 2007, benign breast biopsy.   FAMILY HISTORY: Father had colon cancer. Daughter with diabetes.   HOME MEDICATIONS: Diltiazem extended release 120 mg once a day, losartan 25 mg once a day, Xarelto 15 mg 2 times a day, Aleve p.r.n.   HABITS: Does not smoke. Does not drink.   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   PHYSICAL EXAMINATION: GENERAL: Elderly pleasant white female in no acute distress. Does complain of some shortness of breath and difficulty taking a deep breath.  VITAL SIGNS: Temperature 97.6, pulse 88, respirations 18, blood  pressure 148/92, pulse oximetry 94% on 2 liters.  HEENT: Sclerae anicteric. Conjunctivae slightly pale. Tongue slightly pale and dry.  HEAD: Atraumatic.  NECK: Shows no JVD. No carotid bruits.  CHEST: Inspiratory crackles at the right base, one-fifth of the way up, inspiratory crackles at the left base. There is no egophony present.  HEART: Shows irregular, irregular rhythm. No murmurs I can hear.  ABDOMEN: Soft, flat, nondistended. No hepatosplenomegaly. No tenderness palpable.  EXTREMITIES: No edema. She has compression devices on. RECTAL: Exam was not done.  SKIN: Warm and dry.  PSYCHIATRIC: Mood and affect are appropriate.   LABORATORY, DIAGNOSTIC AND RADIOLOGICAL DATA: Her hemoglobin on presentation yesterday was 6.7, today is 8.2 after transfusions. BUN was 32, repeats 28 today, creatinine 1.34, repeat is 1.22. White count 8.8, platelet count 296. She has O negative blood with a negative antibody screen. A chest x-ray done yesterday shows bilateral pleural effusions and bibasilar atelectasis versus airspace disease.   ASSESSMENT AND PLAN: Very likely she has an upper GI bleed from ulcer, gastritis, duodenitis, related to taking Aleve and the Xarelto probably contributed to the bleeding. The last Xarelto was yesterday evening on the 26th.   RECOMMENDATIONS: 1. Given her respiratory problems and congestive heart failure with positive chest x-ray, she is at somewhat increased risk for endoscopy and sedation. It appears that given her vital signs at her hemoglobin her bleeding has stopped. I would continue her on water today and then start clear liquids tomorrow. I would transfuse her if she dropped  below 8. I  would continue oxygen for now. I would continue IV Protonix every 12 hours for now and follow. If she should rebleed, would be forced to do an endoscopy, but at this time would hold off on doing this.  ____________________________ Manya Silvas, MD rte:sg D: 11/17/2013 09:54:57  ET T: 11/17/2013 10:58:02 ET JOB#: [id number]  cc: Manya Silvas, MD, <Dictator> Javier Docker. Ubaldo Glassing, MD   Manya Silvas MD ELECTRONICALLY SIGNED 12/05/2013 17:35

## 2015-03-15 NOTE — Discharge Summary (Signed)
PATIENT NAME:  Carol Vega, Carol Vega MR#:  [id number] DATE OF BIRTH:  01/10/1921  DATE OF ADMISSION:  12/06/2011 DATE OF DISCHARGE:  12/08/2011  DIAGNOSES: 1. Left lower extremity pain, most likely secondary to ischemia.  2. Atrial fibrillation.  3. Hypertension.   CHIEF COMPLAINT: Pain left lower extremity.   HISTORY OF PRESENT ILLNESS: Carol Vega is an 81 year old female with a history of atrial fibrillation, osteoporosis, history of poor circulation in the legs for several years presented to the ER complaining of pain in the left lower extremity as she was getting out of the restaurant. She described the pain as sharp and started at the bottom of the feet and went up into her leg and also noted that her left leg was numb and cold to touch. She was brought to the Emergency Room. Both lower extremities were cool to touch, left appeared to be worse than right and there was a question of possible embolic phenomenon involving the lower extremity. She was subsequently started on IV heparin, was seen by Dr. Lucky Cowboy, vascular surgeon.   PAST MEDICAL HISTORY:  1. Atrial fibrillation. 2. Hypertension. 3. Osteoporosis.  4. Diverticulosis.  5. Congestive heart failure. 6. Pleural effusion. 7. Aneurysm of the intra-atrial septa.   PAST SURGICAL HISTORY:  1. Benign breast biopsy.  2. Subdural hematoma 2007.   FAMILY HISTORY: Father had colon cancer.    CURRENT MEDICATIONS:  1. Aspirin 81 mg a day.  2. Cozaar 50 mg a day.  3. Diltiazem 120 mg a day.  4. Toprol-XL 100 mg a day.  5. Calcium with vitamin D.   PHYSICAL EXAMINATION: VITAL SIGNS: She is afebrile, pulse 45, respirations 18, blood pressure 134/63, repeat blood pressure was low at 92/53, oxygen saturation 98% on room air. GENERAL: She was not in distress. HEENT: Normocephalic, atraumatic. NECK: Supple. LUNGS: Clear to auscultation bilaterally. HEART: S1, S2. ABDOMEN: Soft, nontender. EXTREMITIES: Evidence of decreased temperature with coolness to  touch in both lower extremities, left appeared to be slightly cooler than the right. Femoral pulses were feebly felt. There was evidence of venous stasis with erythema of both lower extremities.   HOSPITAL COURSE: Left lower extremity pain, possible embolic phenomenon. Patient was started on IV heparin. She was seen in consultation by Dr. Lucky Cowboy who recommended IV heparin and serial exam although CT angiogram was considered but was felt that it was not necessary since patient improved symptomatically on the heparin. Her pain pretty much resolved. She did have an episode of diarrhea which also resolved. She was advised to take Xarelto 15 mg a day as an outpatient and discharge home on the following medications.   DISCHARGE MEDICATIONS:  1. Xarelto 15 mg a day.  2. Diltiazem 120 mg a day.  3. Losartan 50 mg a day.  4. Metoprolol ER 25 mg a day.   FOLLOW UP: She has been advised to follow up with me, Dr. Ginette Pitman, in 1 to 2 weeks' time.   ____________________________ Tracie Harrier, MD vh:cms D: 12/10/2011 12:09:05 ET T: 12/10/2011 13:59:53 ET JOB#: [id number]  cc: Tracie Harrier, MD, <Dictator> Tracie Harrier MD ELECTRONICALLY SIGNED 01/05/2012 12:35

## 2015-03-15 NOTE — Consult Note (Signed)
Asked to see patient for LE ischemia.  Had abrupt onset of pain about 2-3 hours ago at dinner.  Pain has gotten better, but left foot is still cool and no palpable pulses.  Her right pedal pulse are poor as well, but the foot is not as cool.  She has intact neuro function and her pain is minimal at this time.  She has a HR of 38 and a BP of 92/53 currently in a. fib.  Discussed with Dr. Posey Pronto.  Suspect some embolic lesion to left leg in the setting of chronic disease.  Would recommend anticoagulation and serial exams for now.  If has more pain and pain when she gets up, may need angiogram for further evaluation.  Can consider Ct angiogram to delineate level of embolus.  Will follow.  Electronic Signatures: Algernon Huxley (MD)  (Signed on 15-Jan-13 21:05)  Authored  Last Updated: 15-Jan-13 21:05 by Algernon Huxley (MD)

## 2015-03-15 NOTE — H&P (Signed)
PATIENT NAME:  Carol, Vega MR#:  [id number] DATE OF BIRTH:  Aug 13, 1921  DATE OF ADMISSION:  12/06/2011  CHIEF COMPLAINT: Acute pain, left lower extremity.   HISTORY OF PRESENT ILLNESS: Carol Vega is a very pleasant 81 year old Caucasian female who has past medical history of atrial fibrillation, osteoporosis and history of "circulation problems in the legs" for many years, comes to the Emergency Room after she noted acute pain in her left lower extremity while she was getting out of the restaurant this evening. She said the pain was sharp, started in the bottom of her feet up to her thigh, and her leg felt very numb and was cold to touch. She was brought to the Emergency Room. She started feeling a little better. Both lower extremities were cool to touch with no palpable pulses. She is being admitted for possible ischemic limb along with "possible embolic phenomena" and is being started on heparin per Carol Vega recommendation. Carol Vega saw the patient in the Emergency Room.   PAST MEDICAL HISTORY:  1. Atrial fibrillation, chronic.  2. Hypertension.  3. Osteoporosis.  4. Diverticulosis.  5. Congestive heart failure with history of pleural effusion.  6. Aneurysmal interatrial septum.     PAST SURGICAL HISTORY:  1. Benign breast biopsies.  2. Subdural hematoma in 2007.   FAMILY HISTORY: Father had colon cancer.   MEDICATIONS:  1. Aspirin 81 mg daily.  2. Cozaar 50 mg daily.  3. Diltiazem 120 mg daily.  4. Toprol-XL 100 mg daily.  5. Calcium with vitamin D daily.   REVIEW OF SYSTEMS: CONSTITUTIONAL: No fever, fatigue, weakness. Positive for left leg pain. EYES: No blurred or double vision. ENT: No tinnitus, ear pain, hearing loss. RESPIRATORY: No cough, wheeze, hemoptysis, or dyspnea. CARDIOVASCULAR: No chest pain, orthopnea, or edema. GASTROINTESTINAL: No nausea, vomiting, diarrhea, abdominal pain. GENITOURINARY: No dysuria or hematuria. ENDOCRINE: No polyuria or nocturia. HEMATOLOGY: No  anemia or easy bruising. SKIN: No acne or rash. MUSCULOSKELETAL: No arthritis. NEUROLOGICAL: No cerebrovascular accident or transient ischemic attack. PSYCHIATRIC: No anxiety or depression. All other systems are reviewed and negative.   PHYSICAL EXAMINATION:  GENERAL: The patient is awake, alert, and oriented x3.   VITAL SIGNS: Afebrile, pulse is 39 to 45 and regular, respirations 18, blood pressure is 134/63, repeat blood pressure was 92/53, saturations are 98% on room air.   HEENT: Atraumatic, normocephalic. Pupils are equal, round, and reactive to light and accommodation. Extraocular movements are intact. Oral mucosa is moist.   NECK: Supple. No JVD. No carotid bruit.   LUNGS: Clear to auscultation bilaterally. No rales, rhonchi, respiratory distress, or labored breathing.   HEART: Both heart sounds are normal. Rate, rhythm is regular. PMI is not lateralized.   CHEST: Nontender.   EXTREMITIES: Bilateral lower extremities are cool to touch. There are no palpable pulses mainly on the left. I could feel some feeble femoral pulse. Skin over both the lower extremities shows chronic venostasis changes with some spider wings. The patient has had issues with peripheral circulation in the past.   ASSESSMENT: 1. Bradycardia, appears asymptomatic. The patient is on two beta-blocking agents, Toprol and Cardizem, which we will hold.  2. History of chronic atrial fibrillation, was on Coumadin before, now discontinued after the patient had subdural hematoma in 2007.  3. Relative hypotension.  4. History of subdural hematoma in 2007.  PLAN:  1. Admit the patient for Carol Vega.   2. IV fluids.  3. The patient will be started on heparin drip. We  will give her a bolus of 250 mL one time.  4. I will hold off on her home medications, which are losartan, Toprol-XL and diltiazem, for now.  5. Carol Vega, from Vascular Surgery, was consulted in the ER. He has also already seen the patient. He recommends  possible arteriogram, CT angiogram, to delineate the level of thrombus in the morning. If the patient has more pain when she gets up, may need an angiogram for further evaluation.  6. Further work-up according to the patient's clinical course.   The hospital admission plan was discussed with the patient and family members.   TIME SPENT: 50 minutes.   ____________________________ Carol Rochester Posey Pronto, MD sap:cbb D: 12/06/2011 21:38:19 ET T: 12/06/2011 21:46:32 ET JOB#: [id number]  cc: Dathan Attia A. Posey Pronto, MD, <Dictator> Tracie Harrier, MD Ilda Basset MD ELECTRONICALLY SIGNED 12/16/2011 7:28

## 2015-03-15 NOTE — Consult Note (Signed)
PATIENT NAME:  Carol Vega, WESTERGAARD MR#:  [id number] DATE OF BIRTH:  12/16/1920  DATE OF CONSULTATION:  12/06/2011  REFERRING PHYSICIAN:   CONSULTING PHYSICIAN:  Algernon Huxley, MD  REASON FOR CONSULTATION: Left lower extremity ischemia.   HISTORY OF PRESENT ILLNESS: This is an 81 year old female who had an abrupt onset of left lower extremity pain two to three hours earlier. This has improved since coming to the hospital despite the fact she has not yet received her anticoagulation. She had some numbness and tingling which are also better, but not entirely gone. Initially it was a sharp pain at the bottom of her foot all the way up to her thigh and her leg felt numb and cold to the touch. She does not have palpable pulses in either lower extremity, but her left foot is cooler than the right. She has no ischemic ulcerations and was not really claudicating, although it does not sound like she was walking all that much.  PAST MEDICAL HISTORY:  1. Chronic atrial fibrillation.  2. Aneurysmal intraatrial septum.  3. Congestive heart failure with pleural effusion.  4. Hypertension.  5. Diverticulosis.   PAST SURGICAL HISTORY:  1. Breast biopsies. 2. Subdural hematoma.   FAMILY HISTORY: Father had colon cancer.  HOME MEDICATIONS:  1. Aspirin 81 mg daily.  2. Cozaar 50 mg daily.  3. Diltiazem 120 mg daily.  4. Toprol-XL 100 mg daily.  5. Calcium with vitamin D daily.   DRUG ALLERGIES: No known drug allergies.  REVIEW OF SYSTEMS: GENERAL: No fevers or chills. No intentional weight loss or gain. EYES: No blurry or double vision. EARS: No tinnitus or ear pain. CARDIAC: Positive for palpitations and an irregular heart beat. RESPIRATORY: No shortness breath or cough above baseline. GASTROINTESTINAL: No nausea, vomiting, or diarrhea. GENITOURINARY: No dysuria or hematuria. ENDOCRINE: No heat or cold intolerance. HEME: No anemia or easy bruising. SKIN: No new rash or ulcers. PSYCH: No anxiety or depression.  NEUROLOGIC: Some tingling in her left foot. No other history of transient ischemic attack, stroke, or seizure. MUSCULOSKELETAL: Left leg pain, which has improved some.   PHYSICAL EXAMINATION:   GENERAL: This is a frail, elderly white female not in apparent distress. BMI is 17.8.   VITAL SIGNS: Temperature 96.7 and her last recorded pulse was 38 with a blood pressure of 121/59. When I saw her her heart rate was 38 and her blood pressures was more in the 70W systolic. She was not having chest pain at that time.   HEAD: Some temporal wasting is present, otherwise normocephalic and atraumatic.   EYES: Sclerae anicteric. Conjunctivae are clear.   EARS: Normal external appearance. Hearing is diminished.   NECK: Supple without adenopathy or jugular venous distention.   HEART: Irregular, bradycardiac with systolic murmur.   LUNGS: Somewhat diminished but clear bilaterally.   ABDOMEN: Soft, nondistended, nontender.   EXTREMITIES: She has no palpable pedal pulses in either foot. Her right foot is slightly warmer than the left. There is good capillary refill on the right, it is fair on the left.   NEURO: The patient has sensation intact and motor intact on current neurologic examination and at the time I saw her her pain is minimal at most.   SKIN: Warm and dry. Capillary refill as described above.   LABS: Sodium 131, potassium 4.8, chloride 92, CO2 27, BUN 28, creatinine 1.24, glucose 201. White blood cell count 7.7, hemoglobin 14.1, platelet count 314,000. INR 1.0.   ASSESSMENT AND PLAN: This is  an 81 year old white female with what appears to be some acute on chronic left lower extremity ischemia. I suspect there is an embolic component from her slow atrial fibrillation and with her intraseptal aneurysm as well I think some degree of intracardiac embolus source is likely. She has chronic slow atrial fibrillation. At this point, she does not have enough symptoms to warrant intervention or  surgery and would recommend anticoagulation as this will likely propagate without anticoagulation and would certainly be a difficult situation in this frail, elderly patient if we would have to operate or perform any major vascular procedures. I have discussed with her the options. She agrees to proceed with anticoagulation. I have also discussed the case with Dr. Posey Pronto who is admitting the patient. We will follow along and, if she has worsening symptoms, we would consider angiography or CT angiogram for further evaluation.   This is a level-4 consultation.  ____________________________ Algernon Huxley, MD jsd:slb D: 12/19/2011 17:00:06 ET T: 12/20/2011 08:46:21 ET JOB#: [id number]  cc: Algernon Huxley, MD, <Dictator>  Algernon Huxley MD ELECTRONICALLY SIGNED 01/04/2012 11:12

## 2015-04-01 DIAGNOSIS — I481 Persistent atrial fibrillation: Secondary | ICD-10-CM | POA: Diagnosis not present

## 2015-04-01 DIAGNOSIS — I10 Essential (primary) hypertension: Secondary | ICD-10-CM | POA: Diagnosis not present

## 2015-04-01 DIAGNOSIS — I5022 Chronic systolic (congestive) heart failure: Secondary | ICD-10-CM | POA: Diagnosis not present

## 2015-04-17 DIAGNOSIS — M79609 Pain in unspecified limb: Secondary | ICD-10-CM | POA: Diagnosis not present

## 2015-04-24 ENCOUNTER — Encounter: Payer: Self-pay | Admitting: *Deleted

## 2015-04-24 ENCOUNTER — Emergency Department
Admission: EM | Admit: 2015-04-24 | Discharge: 2015-04-25 | Disposition: A | Discharge status: Home / Self Care | Payer: Medicare Other | Attending: Emergency Medicine | Admitting: Emergency Medicine

## 2015-04-24 ENCOUNTER — Encounter: Payer: Self-pay | Admitting: Internal Medicine

## 2015-04-24 ENCOUNTER — Ambulatory Visit (INDEPENDENT_AMBULATORY_CARE_PROVIDER_SITE_OTHER): Payer: Medicare Other | Admitting: Internal Medicine

## 2015-04-24 ENCOUNTER — Emergency Department: Payer: Medicare Other

## 2015-04-24 VITALS — BP 110/60 | HR 92 | Temp 97.9°F | Ht <= 58 in | Wt 94.0 lb

## 2015-04-24 DIAGNOSIS — Z79899 Other long term (current) drug therapy: Secondary | ICD-10-CM | POA: Diagnosis not present

## 2015-04-24 DIAGNOSIS — R0602 Shortness of breath: Secondary | ICD-10-CM

## 2015-04-24 DIAGNOSIS — I509 Heart failure, unspecified: Secondary | ICD-10-CM | POA: Diagnosis not present

## 2015-04-24 DIAGNOSIS — I743 Embolism and thrombosis of arteries of the lower extremities: Secondary | ICD-10-CM

## 2015-04-24 DIAGNOSIS — I10 Essential (primary) hypertension: Secondary | ICD-10-CM

## 2015-04-24 DIAGNOSIS — J441 Chronic obstructive pulmonary disease with (acute) exacerbation: Secondary | ICD-10-CM | POA: Diagnosis not present

## 2015-04-24 DIAGNOSIS — J449 Chronic obstructive pulmonary disease, unspecified: Secondary | ICD-10-CM | POA: Diagnosis not present

## 2015-04-24 DIAGNOSIS — R0600 Dyspnea, unspecified: Secondary | ICD-10-CM

## 2015-04-24 LAB — CBC
HCT: 39.1 % (ref 35.0–47.0)
Hemoglobin: 13.2 g/dL (ref 12.0–16.0)
MCH: 32.5 pg (ref 26.0–34.0)
MCHC: 33.8 g/dL (ref 32.0–36.0)
MCV: 96.3 fL (ref 80.0–100.0)
Platelets: 264 10*3/uL (ref 150–440)
RBC: 4.05 MIL/uL (ref 3.80–5.20)
RDW: 12.8 % (ref 11.5–14.5)
WBC: 9.2 10*3/uL (ref 3.6–11.0)

## 2015-04-24 LAB — TROPONIN I: Troponin I: <0.03 ng/mL (ref <0.031)

## 2015-04-24 LAB — BASIC METABOLIC PANEL
Anion gap: 8 (ref 5–15)
BUN: 21 mg/dL — AB (ref 6–20)
CO2: 27 mmol/L (ref 22–32)
Calcium: 9.1 mg/dL (ref 8.9–10.3)
Chloride: 99 mmol/L — ABNORMAL LOW (ref 101–111)
Creatinine, Ser: 1.09 mg/dL — ABNORMAL HIGH (ref 0.44–1.00)
GFR calc non Af Amer: 43 mL/min — ABNORMAL LOW (ref >60)
GFR, EST AFRICAN AMERICAN: 50 mL/min — AB (ref >60)
Glucose, Bld: 110 mg/dL — ABNORMAL HIGH (ref 65–99)
Potassium: 4.4 mmol/L (ref 3.5–5.1)
Sodium: 134 mmol/L — ABNORMAL LOW (ref 135–145)

## 2015-04-24 LAB — BRAIN NATRIURETIC PEPTIDE: B Natriuretic Peptide: 185 pg/mL — ABNORMAL HIGH (ref 0.0–100.0)

## 2015-04-24 NOTE — Progress Notes (Signed)
Pre visit review using our clinic review tool, if applicable. No additional management support is needed unless otherwise documented below in the visit note. 

## 2015-04-24 NOTE — ED Provider Notes (Signed)
[name] Emergency Department Provider Note  ____________________________________________  Time seen: 11:00 PM  I have reviewed the triage vital signs and the nursing notes.   HISTORY  Chief Complaint Shortness of Breath      HPI Carol Vega is a 81 y.o. female resents with history of dyspnea today that is now resolved. Per patient and family patient had a very active day today she was out for considerable portion of the day. Patient per family usually requires supplemental oxygen when she exerts herself. Patient denies any symptoms at present she denies any chest pain no nausea no vomiting no dizziness    Past Medical History  Diagnosis Date  . Osteoporosis   . Diverticulosis   . CHF (congestive heart failure)     h/o pleural effusions  . Atrial fibrillation   . Hypertension   . Aneurysm     inter atrial septum  . Subdural hematoma     admitted 2007    Patient Active Problem List   Diagnosis Date Noted  . SOB (shortness of breath) 04/24/2015  . Health care maintenance 01/26/2015  . GI bleed 12/08/2013  . CHF (congestive heart failure) 12/08/2013  . Hyponatremia 02/10/2013  . Atrial fibrillation 09/30/2012  . Hypertension 09/30/2012  . 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 Osteoporosis 09/30/2012  . Anemia 09/30/2012    Past Surgical History  Procedure Laterality Date  . Breast biopsy      benign  . Tonsilectomy/adenoidectomy with myringotomy  1930    Current Outpatient Rx  Name  Route  Sig  Dispense  Refill  . Ascorbic Acid (VITAMIN C PO)   Oral   Take by mouth.         Marland Kitchen CALCIUM-MAGNESIUM PO   Oral   Take by mouth daily.         Marland Kitchen diltiazem (DILACOR XR) 120 MG 24 hr capsule      TAKE ONE (1) CAPSULE EACH DAY   30 capsule   6   . ferrous sulfate 325 (65 FE) MG tablet      TAKE ONE TABLET BY MOUTH THREE TIMES A DAY WITH FOOD. Patient taking differently: TAKE ONE TABLET BY MOUTH  ONCE  A  DAY WITH FOOD.   90 tablet   1   . losartan (COZAAR) 50 MG tablet      TAKE ONE (1) TABLET BY MOUTH EVERY DAY   90 tablet   1   . Multiple Vitamins-Minerals (PRESERVISION AREDS 2) CAPS   Oral   Take by mouth 2 (two) times daily.         Marland Kitchen NEXIUM 40 MG capsule      TAKE 1 CAPSULE EVERY DAY AT 12 NOON   30 capsule   5     Allergies Review of patient's allergies indicates no known allergies.  Family History  Problem Relation Age of Onset  . Colon cancer Father     Social History History  Substance Use Topics  . Smoking status: Never Smoker   . Smokeless tobacco: Never Used  . Alcohol Use: 0.0 oz/week    0 Standard drinks or equivalent per week     Comment: occasional glass of wine    Review of Systems  Constitutional: Negative for fever. Eyes: Negative for visual changes. ENT: Negative for sore throat. Cardiovascular: Negative for chest pain. Respiratory: Positive for shortness of breath. Gastrointestinal: Negative for abdominal pain, vomiting and diarrhea. Genitourinary: Negative for dysuria. Musculoskeletal: Negative  for back pain. Skin: Negative for rash. Neurological: Negative for headaches, focal weakness or numbness.   10-point ROS otherwise negative.  ____________________________________________   PHYSICAL EXAM:  VITAL SIGNS: ED Triage Vitals  Enc Vitals Group     BP 04/24/15 1751 120/73 mmHg     Pulse Rate 04/24/15 1751 79     Resp 04/24/15 1751 20     Temp 04/24/15 1751 98.1 F (36.7 C)     Temp Source 04/24/15 1751 Oral     SpO2 04/24/15 1751 99 %     Weight 04/24/15 1751 94 lb (42.638 kg)     Height --      Head Cir --      Peak Flow --      Pain Score --      Pain Loc --      Pain Edu? --      Excl. in [place]? --     Constitutional: Alert and oriented. Well appearing and in no distress. Eyes: Conjunctivae are normal. PERRL. Normal extraocular movements. ENT   Head: Normocephalic and atraumatic.   Nose: No  congestion/rhinnorhea.   Mouth/Throat: Mucous membranes are moist.   Neck: No stridor. Cardiovascular: Normal rate, regular rhythm. Normal and symmetric distal pulses are present in all extremities. No murmurs, rubs, or gallops. Respiratory: Normal respiratory effort without tachypnea nor retractions. Breath sounds are clear and equal bilaterally. No wheezes/rales/rhonchi. Gastrointestinal: Soft and nontender. No distention. There is no CVA tenderness. Genitourinary: deferred Musculoskeletal: Nontender with normal range of motion in all extremities. No joint effusions.  No lower extremity tenderness nor edema. Neurologic:  Normal speech and language. No [place] focal neurologic deficits are appreciated. Speech is normal.  Skin:  Skin is warm, dry and intact. No rash noted. Psychiatric: Mood and affect are normal. Speech and behavior are normal. Patient exhibits appropriate insight and judgment.  ____________________________________________    LABS (pertinent positives/negatives)  Labs Reviewed  BASIC METABOLIC PANEL - Abnormal; Notable for the following:    Sodium 134 (*)    Chloride 99 (*)    Glucose, Bld 110 (*)    BUN 21 (*)    Creatinine, Ser 1.09 (*)    GFR calc non Af Amer 43 (*)    GFR calc Af Amer 50 (*)    All other components within normal limits  BRAIN NATRIURETIC PEPTIDE - Abnormal; Notable for the following:    B Natriuretic Peptide 185.0 (*)    All other components within normal limits  CBC  TROPONIN I     ____________________________________________   EKG   Date: 04/24/2015  Rate: 82  Rhythm: normal sinus rhythm  QRS Axis: normal  Intervals: normal  ST/T Wave abnormalities: normal  Conduction Disutrbances: none  Narrative Interpretation: unremarkable      ____________________________________________    RADIOLOGY  Chest x-ray revealed moderate cardiomegaly and COPD.  ____________________________________________      INITIAL  IMPRESSION / ASSESSMENT AND PLAN / ED COURSE  Pertinent labs & imaging results that were available during my care of the patient were reviewed by me and considered in my medical decision making (see chart for details).  History of physical exam consistent with possible mild COPD exacerbation that is now resolved.  ____________________________________________   FINAL CLINICAL IMPRESSION(S) / ED DIAGNOSES  Final diagnoses:  Dyspnea  Chronic obstructive pulmonary disease with acute exacerbation      Gregor Hams, MD 04/25/15 [phone]

## 2015-04-24 NOTE — Progress Notes (Signed)
Patient ID: Carol Vega, female   DOB: [birth year], 81 y.o.   MRN: [id number]   Subjective:    Patient ID: Carol Vega, female    DOB: Sep 11, 1921, 81 y.o.   MRN: [id number]  HPI  Patient here for a scheduled follow up.  She is accompanied by her two daughters.  She is more sob.  Noticed worsening sob with her shower this am.  Also became extremely sob with walking in for her appt (from the parking lot).  Daughter described an episode of epigastric pain today.  This seems to be waxing and waning.  No vomiting.  No nausea.  Weak with standing.  More winded with standing.  No bowel change.  Has known afib.     Past Medical History  Diagnosis Date  . Osteoporosis   . Diverticulosis   . CHF (congestive heart failure)     h/o pleural effusions  . Atrial fibrillation   . Hypertension   . Aneurysm     inter atrial septum  . Subdural hematoma     admitted 2007    Current Outpatient Prescriptions on File Prior to Visit  Medication Sig Dispense Refill  . Ascorbic Acid (VITAMIN C PO) Take by mouth.    Marland Kitchen CALCIUM-MAGNESIUM PO Take by mouth daily.    Marland Kitchen diltiazem (DILACOR XR) 120 MG 24 hr capsule TAKE ONE (1) CAPSULE EACH DAY 30 capsule 6  . ferrous sulfate 325 (65 FE) MG tablet TAKE ONE TABLET BY MOUTH THREE TIMES A DAY WITH FOOD. (Patient taking differently: TAKE ONE TABLET BY MOUTH  ONCE A  DAY WITH FOOD.) 90 tablet 1  . losartan (COZAAR) 50 MG tablet TAKE ONE (1) TABLET BY MOUTH EVERY DAY 90 tablet 1  . Multiple Vitamins-Minerals (PRESERVISION AREDS 2) CAPS Take by mouth 2 (two) times daily.    Marland Kitchen NEXIUM 40 MG capsule TAKE 1 CAPSULE EVERY DAY AT 12 NOON 30 capsule 5   No current facility-administered medications on file prior to visit.    Review of Systems  Constitutional: Positive for fatigue.       Weight stable from last check, but decreased from previous.    HENT: Negative for congestion and sinus pressure.   Respiratory: Positive for shortness of breath (especially iwth exertion. ).  Negative for cough and chest tightness.   Cardiovascular: Negative for chest pain and palpitations.  Gastrointestinal: Positive for abdominal pain (epigastric pain). Negative for nausea, vomiting, diarrhea and constipation.  Genitourinary: Negative for dysuria and difficulty urinating.  Neurological: Negative for dizziness and headaches.  Psychiatric/Behavioral: Negative for dysphoric mood and agitation.       Objective:     Blood pressure standing 92/62, lying 108/60  Physical Exam  HENT:  Nose: Nose normal.  Mouth/Throat: Oropharynx is clear and moist.  Neck: Neck supple.  Cardiovascular: Normal rate.   Irregular rhythm.   Pulmonary/Chest: Breath sounds normal. She has no wheezes.  Abdominal: Soft. Bowel sounds are normal. There is tenderness.  Epigastric tenderness to palpation.   Musculoskeletal: She exhibits no edema or tenderness.  Lymphadenopathy:    She has no cervical adenopathy.  Skin: No rash noted. No erythema.  Psychiatric: She has a normal mood and affect. Her behavior is normal.    BP 110/60 mmHg  Pulse 92  Temp(Src) 97.9 F (36.6 C) (Oral)  Ht 4\' 10"  (1.473 m)  Wt 94 lb (42.638 kg)  BMI 19.65 kg/m2  SpO2 98% Wt Readings from Last 3 Encounters:  04/24/15 94 lb (42.638 kg)  01/22/15 94 lb 4 oz (42.752 kg)  10/20/14 101 lb 8 oz (46.04 kg)     Lab Results  Component Value Date   WBC 9.1 01/22/2015   HGB 13.0 01/22/2015   HCT 38.5 01/22/2015   PLT 299.0 01/22/2015   GLUCOSE 155* 01/22/2015   CHOL 181 10/14/2014   TRIG 98.0 10/14/2014   HDL 49.50 10/14/2014   LDLCALC 112* 10/14/2014   ALT 12 01/22/2015   AST 16 01/22/2015   NA 136 01/22/2015   K 4.2 01/22/2015   CL 101 01/22/2015   CREATININE 1.14 01/22/2015   BUN 18 01/22/2015   CO2 29 01/22/2015   TSH 0.56 06/13/2014   INR 1.0 12/06/2011   HGBA1C 5.5 06/13/2014       Assessment & Plan:   Problem List Items Addressed This Visit    CHF (congestive heart failure)    Does not appear to  be volume overloaded.  Is orthostatic on exam.  Weak and more sob.  Increased pain as outlined.  To ER for evaluation and treatment.  Pt and family in agreement.       Hypertension    Is orthostatic on exam today.  Unclear as to the etiology.  To ER for evaluation.        SOB (shortness of breath) - Primary    Increased sob and DOE.  Became sob with getting up and down from the exam table.  Weak with standing.  Orthostatic on exam.  EKG revealed afib with non specific changes.  Given this as well as the waxing and waning increased pain and discomfort, I feel she warrants further evaluation.  Discussed with pt and family and they are agreeable for ER evaluation.  ER triage notified.        Relevant Orders   EKG 12-Lead (Completed)     I spent 40 minutes with the patient and more than 50% of the time was spent in consultation regarding the above.     Einar Pheasant, MD

## 2015-04-24 NOTE — Assessment & Plan Note (Signed)
Does not appear to be volume overloaded.  Is orthostatic on exam.  Weak and more sob.  Increased pain as outlined.  To ER for evaluation and treatment.  Pt and family in agreement.

## 2015-04-24 NOTE — Discharge Instructions (Signed)

## 2015-04-24 NOTE — Assessment & Plan Note (Signed)
Is orthostatic on exam today.  Unclear as to the etiology.  To ER for evaluation.

## 2015-04-24 NOTE — ED Notes (Signed)
Patient transported to X-ray 

## 2015-04-24 NOTE — Assessment & Plan Note (Signed)
Increased sob and DOE.  Became sob with getting up and down from the exam table.  Weak with standing.  Orthostatic on exam.  EKG revealed afib with non specific changes.  Given this as well as the waxing and waning increased pain and discomfort, I feel she warrants further evaluation.  Discussed with pt and family and they are agreeable for ER evaluation.  ER triage notified.

## 2015-06-10 ENCOUNTER — Other Ambulatory Visit: Payer: Self-pay | Admitting: Internal Medicine

## 2015-07-06 DIAGNOSIS — D649 Anemia, unspecified: Secondary | ICD-10-CM | POA: Diagnosis not present

## 2015-07-06 DIAGNOSIS — I5022 Chronic systolic (congestive) heart failure: Secondary | ICD-10-CM | POA: Diagnosis not present

## 2015-07-06 DIAGNOSIS — I481 Persistent atrial fibrillation: Secondary | ICD-10-CM | POA: Diagnosis not present

## 2015-07-06 DIAGNOSIS — I10 Essential (primary) hypertension: Secondary | ICD-10-CM | POA: Diagnosis not present

## 2015-08-03 ENCOUNTER — Other Ambulatory Visit: Payer: Self-pay | Admitting: Internal Medicine

## 2015-08-25 DIAGNOSIS — L570 Actinic keratosis: Secondary | ICD-10-CM | POA: Diagnosis not present

## 2015-08-25 DIAGNOSIS — L821 Other seborrheic keratosis: Secondary | ICD-10-CM | POA: Diagnosis not present

## 2015-08-25 DIAGNOSIS — L814 Other melanin hyperpigmentation: Secondary | ICD-10-CM | POA: Diagnosis not present

## 2015-08-25 DIAGNOSIS — D492 Neoplasm of unspecified behavior of bone, soft tissue, and skin: Secondary | ICD-10-CM | POA: Diagnosis not present

## 2015-08-25 DIAGNOSIS — Z85828 Personal history of other malignant neoplasm of skin: Secondary | ICD-10-CM | POA: Diagnosis not present

## 2015-09-11 ENCOUNTER — Other Ambulatory Visit: Payer: Self-pay | Admitting: Internal Medicine

## 2015-10-01 ENCOUNTER — Ambulatory Visit (INDEPENDENT_AMBULATORY_CARE_PROVIDER_SITE_OTHER): Payer: Medicare Other | Admitting: Podiatry

## 2015-10-01 DIAGNOSIS — M79673 Pain in unspecified foot: Secondary | ICD-10-CM

## 2015-10-01 DIAGNOSIS — B351 Tinea unguium: Secondary | ICD-10-CM

## 2015-10-01 NOTE — Progress Notes (Signed)
Subjective: 81 y.o. returns the office today for painful, elongated, thickened toenails which she cannot trim herself. She presents today with her daughter. Denies any redness or drainage around the nails. Denies any acute changes since last appointment and no new complaints today. Denies any systemic complaints such as fevers, chills, nausea, vomiting.   Objective: AAO 3, NAD DP/PT pulses palpable, CRT less than 3 seconds Nails hypertrophic, dystrophic, elongated, brittle, discolored 10. There is tenderness overlying the nails 1-5 bilaterally. There is no surrounding erythema or drainage along the nail sites. No open lesions or pre-ulcerative lesions are identified. No other areas of tenderness bilateral lower extremities. No overlying edema, erythema, increased warmth. No pain with calf compression, swelling, warmth, erythema.  Assessment: Patient presents with symptomatic onychomycosis  Plan: -Treatment options including alternatives, risks, complications were discussed -Nails sharply debrided 10 without complication/bleeding. -Discussed daily foot inspection. If there are any changes, to call the office immediately.  -Follow-up in 3 months or sooner if any problems are to arise. In the meantime, encouraged to call the office with any questions, concerns, changes symptoms.  Celesta Gentile, DPM

## 2015-11-18 ENCOUNTER — Encounter: Payer: Self-pay | Admitting: *Deleted

## 2015-11-26 ENCOUNTER — Other Ambulatory Visit: Payer: Self-pay | Admitting: Internal Medicine

## 2015-12-24 ENCOUNTER — Encounter: Payer: Self-pay | Admitting: Podiatry

## 2015-12-24 ENCOUNTER — Ambulatory Visit (INDEPENDENT_AMBULATORY_CARE_PROVIDER_SITE_OTHER): Payer: Medicare Other | Admitting: Podiatry

## 2015-12-24 DIAGNOSIS — B351 Tinea unguium: Secondary | ICD-10-CM | POA: Diagnosis not present

## 2015-12-24 DIAGNOSIS — M79673 Pain in unspecified foot: Secondary | ICD-10-CM

## 2015-12-24 DIAGNOSIS — L819 Disorder of pigmentation, unspecified: Secondary | ICD-10-CM | POA: Diagnosis not present

## 2015-12-24 NOTE — Progress Notes (Signed)
Patient ID: Carol Vega, female   DOB: [birth year], 82 y.o.   MRN: [id number]  Subjective: 82 y.o. returns the office today for painful, elongated, thickened toenails which she cannot trim herself. She presents today with her daughter. Denies any redness or drainage around the nails. Denies any acute changes since last appointment and no new complaints today. Denies any systemic complaints such as fevers, chills, nausea, vomiting.   Objective: AAO 3, NAD DP/PT pulses palpable, CRT less than 3 seconds Nails hypertrophic, dystrophic, elongated, brittle, discolored 10. There is tenderness overlying the nails 1-5 bilaterally. There is no surrounding erythema or drainage along the nail sites. No open lesions or pre-ulcerative lesions are identified. On the lateral left 2nd digit is a hyperpigmented lesion measuring 0.2 x 0.2 cm and uniform in color. No surrounding erythema or drainage, no pain.  No other areas of tenderness bilateral lower extremities. No overlying edema, erythema, increased warmth. No pain with calf compression, swelling, warmth, erythema.  Assessment: Patient presents with symptomatic onychomycosis; hyperpigmented lesion   Plan: -Treatment options including alternatives, risks, complications were discussed -Nails sharply debrided 10 without complication/bleeding. -Hyperpigmented lesion was shaved and sent for biopsy. Recommended follow-up with her dermatologist as well. She has a history of melanoma per her daughter.  -Discussed daily foot inspection. If there are any changes, to call the office immediately.  -Follow-up in 3 months or after biopsy is returned or sooner if any problems are to arise. In the meantime, encouraged to call the office with any questions, concerns, changes symptoms.  Celesta Gentile, DPM

## 2015-12-24 NOTE — Patient Instructions (Signed)
I sent a sample of the skin lesion on the left 2nd toe to the lab. Will call you with the results. Also, recommend dermatology evaluation.

## 2016-01-06 DIAGNOSIS — I10 Essential (primary) hypertension: Secondary | ICD-10-CM | POA: Diagnosis not present

## 2016-01-06 DIAGNOSIS — I482 Chronic atrial fibrillation: Secondary | ICD-10-CM | POA: Diagnosis not present

## 2016-01-06 DIAGNOSIS — I5022 Chronic systolic (congestive) heart failure: Secondary | ICD-10-CM | POA: Diagnosis not present

## 2016-01-06 DIAGNOSIS — I5032 Chronic diastolic (congestive) heart failure: Secondary | ICD-10-CM | POA: Diagnosis not present

## 2016-01-06 DIAGNOSIS — I4891 Unspecified atrial fibrillation: Secondary | ICD-10-CM | POA: Diagnosis not present

## 2016-01-13 ENCOUNTER — Telehealth: Payer: Self-pay | Admitting: *Deleted

## 2016-01-13 NOTE — Telephone Encounter (Signed)
Spoke with Izora Gala yesterday about patient's results of the nail culture and stated what Dr Jacqualyn Posey had stated and they already have an appointment with the dermatology in a month. Lattie Haw

## 2016-01-21 ENCOUNTER — Other Ambulatory Visit: Payer: Self-pay | Admitting: Internal Medicine

## 2016-03-21 ENCOUNTER — Other Ambulatory Visit: Payer: Self-pay | Admitting: Internal Medicine

## 2016-04-25 DIAGNOSIS — I70213 Atherosclerosis of native arteries of extremities with intermittent claudication, bilateral legs: Secondary | ICD-10-CM | POA: Diagnosis not present

## 2016-04-25 DIAGNOSIS — M79609 Pain in unspecified limb: Secondary | ICD-10-CM | POA: Diagnosis not present

## 2016-04-26 ENCOUNTER — Encounter: Payer: Self-pay | Admitting: Podiatry

## 2016-04-26 ENCOUNTER — Ambulatory Visit (INDEPENDENT_AMBULATORY_CARE_PROVIDER_SITE_OTHER): Payer: Medicare Other | Admitting: Podiatry

## 2016-04-26 DIAGNOSIS — M79673 Pain in unspecified foot: Secondary | ICD-10-CM | POA: Diagnosis not present

## 2016-04-26 DIAGNOSIS — B351 Tinea unguium: Secondary | ICD-10-CM

## 2016-04-26 NOTE — Progress Notes (Signed)
Patient ID: Carol Vega, female   DOB: Jun 25, 1921, 82 y.o.   MRN: [id number]  Subjective: 82 y.o. returns the office today for painful, elongated, thickened toenails which she cannot trim herself. She presents today with her daughter. Denies any redness or drainage around the nails. Denies any acute changes since last appointment and no new complaints today. Denies any systemic complaints such as fevers, chills, nausea, vomiting.   Objective: AAO 3, NAD DP/PT pulses palpable, CRT less than 3 seconds Nails hypertrophic, dystrophic, elongated, brittle, discolored 10. There is tenderness overlying the nails 1-5 bilaterally. There is no surrounding erythema or drainage along the nail sites. No open lesions or pre-ulcerative lesions are identified. No other areas of tenderness bilateral lower extremities. No overlying edema, erythema, increased warmth. No pain with calf compression, swelling, warmth, erythema.  Assessment: Patient presents with symptomatic onychomycosis; hyperpigmented lesion   Plan: -Treatment options including alternatives, risks, complications were discussed -Nails sharply debrided 10 without complication/bleeding. -Dermatology for hyperpigmented lesion.  -Discussed daily foot inspection. If there are any changes, to call the office immediately.  -Follow-up in 3 months or sooner if any problems are to arise. In the meantime, encouraged to call the office with any questions, concerns, changes symptoms.  Celesta Gentile, DPM

## 2016-05-23 ENCOUNTER — Telehealth: Payer: Self-pay | Admitting: Internal Medicine

## 2016-05-31 ENCOUNTER — Other Ambulatory Visit: Payer: Self-pay | Admitting: Internal Medicine

## 2016-05-31 MED ORDER — NEXIUM 40 MG PO CPDR: DELAYED_RELEASE_CAPSULE | ORAL | Status: DC

## 2016-05-31 NOTE — Telephone Encounter (Signed)
Refilled till appt. thanks

## 2016-05-31 NOTE — Telephone Encounter (Signed)
Pt daughter called and pt is scheduled for 08/22/16. Thank you!

## 2016-05-31 NOTE — Telephone Encounter (Signed)
Pt needs medication refill. Thank you!

## 2016-05-31 NOTE — Addendum Note (Signed)
Addended by: Bevelyn Ngo on: 05/31/2016 10:21 AM   Modules accepted: Orders

## 2016-07-06 DIAGNOSIS — I10 Essential (primary) hypertension: Secondary | ICD-10-CM | POA: Diagnosis not present

## 2016-07-06 DIAGNOSIS — I4891 Unspecified atrial fibrillation: Secondary | ICD-10-CM | POA: Diagnosis not present

## 2016-07-06 DIAGNOSIS — I5022 Chronic systolic (congestive) heart failure: Secondary | ICD-10-CM | POA: Diagnosis not present

## 2016-07-15 DIAGNOSIS — Z961 Presence of intraocular lens: Secondary | ICD-10-CM | POA: Diagnosis not present

## 2016-07-21 ENCOUNTER — Encounter: Payer: Self-pay | Admitting: Podiatry

## 2016-07-21 ENCOUNTER — Ambulatory Visit (INDEPENDENT_AMBULATORY_CARE_PROVIDER_SITE_OTHER): Payer: Medicare Other | Admitting: Podiatry

## 2016-07-21 DIAGNOSIS — M79673 Pain in unspecified foot: Secondary | ICD-10-CM | POA: Diagnosis not present

## 2016-07-21 DIAGNOSIS — B351 Tinea unguium: Secondary | ICD-10-CM | POA: Diagnosis not present

## 2016-07-21 NOTE — Progress Notes (Signed)
Patient ID: Carol Vega, female   DOB: [birth year], 82 y.o.   MRN: [id number]  Subjective: 82 y.o. returns the office today for painful, elongated, thickened toenails which she cannot trim herself. She presents today with her daughter. Denies any acute changes since last appointment and no new complaints today. Denies any systemic complaints such as fevers, chills, nausea, vomiting.   Objective: AAO 3, NAD DP/PT pulses palpable, CRT less than 3 seconds Nails hypertrophic, dystrophic, elongated, brittle, discolored 10. There is tenderness overlying the nails 1-5 bilaterally. There is no surrounding erythema or drainage along the nail sites. No open lesions or pre-ulcerative lesions are identified. No other areas of tenderness bilateral lower extremities. No overlying edema, erythema, increased warmth. No pain with calf compression, swelling, warmth, erythema.  Assessment: Patient presents with symptomatic onychomycosis; hyperpigmented lesion   Plan: -Treatment options including alternatives, risks, complications were discussed -Nails sharply debrided 10 without complication/bleeding. -Discussed daily foot inspection. If there are any changes, to call the office immediately.  -Follow-up in 3 months or sooner if any problems are to arise. In the meantime, encouraged to call the office with any questions, concerns, changes symptoms.  Celesta Gentile, DPM

## 2016-08-04 ENCOUNTER — Other Ambulatory Visit: Payer: Self-pay | Admitting: Internal Medicine

## 2016-08-08 ENCOUNTER — Other Ambulatory Visit: Payer: Self-pay | Admitting: Internal Medicine

## 2016-08-11 ENCOUNTER — Other Ambulatory Visit: Payer: Self-pay

## 2016-08-11 ENCOUNTER — Other Ambulatory Visit: Payer: Self-pay | Admitting: Internal Medicine

## 2016-08-11 MED ORDER — FERROUS SULFATE 325 (65 FE) MG PO TABS: ORAL_TABLET | ORAL | 4 refills | Status: AC

## 2016-08-12 ENCOUNTER — Other Ambulatory Visit: Payer: Self-pay | Admitting: Internal Medicine

## 2016-08-12 DIAGNOSIS — D649 Anemia, unspecified: Secondary | ICD-10-CM

## 2016-08-12 DIAGNOSIS — E871 Hypo-osmolality and hyponatremia: Secondary | ICD-10-CM

## 2016-08-12 DIAGNOSIS — I10 Essential (primary) hypertension: Secondary | ICD-10-CM

## 2016-08-12 DIAGNOSIS — M810 Age-related osteoporosis without current pathological fracture: Secondary | ICD-10-CM

## 2016-08-12 DIAGNOSIS — R739 Hyperglycemia, unspecified: Secondary | ICD-10-CM

## 2016-08-12 DIAGNOSIS — I4891 Unspecified atrial fibrillation: Secondary | ICD-10-CM

## 2016-08-12 DIAGNOSIS — I509 Heart failure, unspecified: Secondary | ICD-10-CM

## 2016-08-19 ENCOUNTER — Other Ambulatory Visit (INDEPENDENT_AMBULATORY_CARE_PROVIDER_SITE_OTHER): Payer: Medicare Other

## 2016-08-19 DIAGNOSIS — M810 Age-related osteoporosis without current pathological fracture: Secondary | ICD-10-CM

## 2016-08-19 DIAGNOSIS — I509 Heart failure, unspecified: Secondary | ICD-10-CM | POA: Diagnosis not present

## 2016-08-19 DIAGNOSIS — I4891 Unspecified atrial fibrillation: Secondary | ICD-10-CM | POA: Diagnosis not present

## 2016-08-19 DIAGNOSIS — I10 Essential (primary) hypertension: Secondary | ICD-10-CM

## 2016-08-19 DIAGNOSIS — D649 Anemia, unspecified: Secondary | ICD-10-CM | POA: Diagnosis not present

## 2016-08-19 DIAGNOSIS — R739 Hyperglycemia, unspecified: Secondary | ICD-10-CM | POA: Diagnosis not present

## 2016-08-19 LAB — LIPID PANEL
CHOLESTEROL: 164 mg/dL (ref 0–200)
HDL: 48.7 mg/dL (ref >39.00)
LDL Cholesterol: 86 mg/dL (ref 0–99)
NonHDL: 115.14
Total CHOL/HDL Ratio: 3
Triglycerides: 147 mg/dL (ref 0.0–149.0)
VLDL: 29.4 mg/dL (ref 0.0–40.0)

## 2016-08-19 LAB — CBC WITH DIFFERENTIAL/PLATELET
BASOS PCT: 0.4 % (ref 0.0–3.0)
Basophils Absolute: 0 10*3/uL (ref 0.0–0.1)
EOS ABS: 0.1 10*3/uL (ref 0.0–0.7)
Eosinophils Relative: 1.8 % (ref 0.0–5.0)
HEMATOCRIT: 35.8 % — AB (ref 36.0–46.0)
HEMOGLOBIN: 12.1 g/dL (ref 12.0–15.0)
LYMPHS PCT: 20 % (ref 12.0–46.0)
Lymphs Abs: 1.5 10*3/uL (ref 0.7–4.0)
MCHC: 33.8 g/dL (ref 30.0–36.0)
MCV: 96.9 fl (ref 78.0–100.0)
Monocytes Absolute: 0.8 10*3/uL (ref 0.1–1.0)
Monocytes Relative: 9.9 % (ref 3.0–12.0)
Neutro Abs: 5.2 10*3/uL (ref 1.4–7.7)
Neutrophils Relative %: 67.9 % (ref 43.0–77.0)
Platelets: 313 10*3/uL (ref 150.0–400.0)
RBC: 3.69 Mil/uL — ABNORMAL LOW (ref 3.87–5.11)
RDW: 13.7 % (ref 11.5–15.5)
WBC: 7.6 10*3/uL (ref 4.0–10.5)

## 2016-08-19 LAB — BASIC METABOLIC PANEL
BUN: 21 mg/dL (ref 6–23)
CALCIUM: 9 mg/dL (ref 8.4–10.5)
CHLORIDE: 96 meq/L (ref 96–112)
CO2: 32 mEq/L (ref 19–32)
CREATININE: 1 mg/dL (ref 0.40–1.20)
GFR: 54.98 mL/min — AB (ref >60.00)
Glucose, Bld: 111 mg/dL — ABNORMAL HIGH (ref 70–99)
Potassium: 4.6 mEq/L (ref 3.5–5.1)
SODIUM: 135 meq/L (ref 135–145)

## 2016-08-19 LAB — TSH: TSH: 4.46 u[IU]/mL (ref 0.35–4.50)

## 2016-08-19 LAB — HEPATIC FUNCTION PANEL
ALK PHOS: 66 U/L (ref 39–117)
ALT: 13 U/L (ref 0–35)
AST: 17 U/L (ref 0–37)
Albumin: 3.6 g/dL (ref 3.5–5.2)
BILIRUBIN DIRECT: 0.1 mg/dL (ref 0.0–0.3)
BILIRUBIN TOTAL: 0.6 mg/dL (ref 0.2–1.2)
Total Protein: 6.7 g/dL (ref 6.0–8.3)

## 2016-08-19 LAB — IBC PANEL
IRON: 70 ug/dL (ref 42–145)
Saturation Ratios: 29.4 % (ref 20.0–50.0)
TRANSFERRIN: 170 mg/dL — AB (ref 212.0–360.0)

## 2016-08-19 LAB — FERRITIN: Ferritin: 255.2 ng/mL (ref 10.0–291.0)

## 2016-08-19 LAB — HEMOGLOBIN A1C: Hgb A1c MFr Bld: 5.2 % (ref 4.6–6.5)

## 2016-08-19 LAB — VITAMIN D 25 HYDROXY (VIT D DEFICIENCY, FRACTURES): VITD: 28.2 ng/mL — AB (ref 30.00–100.00)

## 2016-08-22 ENCOUNTER — Ambulatory Visit (INDEPENDENT_AMBULATORY_CARE_PROVIDER_SITE_OTHER): Payer: Medicare Other | Admitting: Internal Medicine

## 2016-08-22 ENCOUNTER — Encounter: Payer: Self-pay | Admitting: Internal Medicine

## 2016-08-22 VITALS — BP 112/60 | HR 83 | Temp 98.1°F | Ht <= 58 in | Wt 92.2 lb

## 2016-08-22 DIAGNOSIS — I4891 Unspecified atrial fibrillation: Secondary | ICD-10-CM

## 2016-08-22 DIAGNOSIS — R1310 Dysphagia, unspecified: Secondary | ICD-10-CM

## 2016-08-22 DIAGNOSIS — I10 Essential (primary) hypertension: Secondary | ICD-10-CM | POA: Diagnosis not present

## 2016-08-22 DIAGNOSIS — Z23 Encounter for immunization: Secondary | ICD-10-CM | POA: Diagnosis not present

## 2016-08-22 DIAGNOSIS — R739 Hyperglycemia, unspecified: Secondary | ICD-10-CM

## 2016-08-22 DIAGNOSIS — I509 Heart failure, unspecified: Secondary | ICD-10-CM

## 2016-08-22 DIAGNOSIS — E871 Hypo-osmolality and hyponatremia: Secondary | ICD-10-CM | POA: Diagnosis not present

## 2016-08-22 DIAGNOSIS — D649 Anemia, unspecified: Secondary | ICD-10-CM

## 2016-08-22 NOTE — Progress Notes (Signed)
Pre visit review using our clinic review tool, if applicable. No additional management support is needed unless otherwise documented below in the visit note. 

## 2016-08-22 NOTE — Progress Notes (Signed)
Patient ID: Carol Vega, female   DOB: 1921/02/07, 82 y.o.   MRN: [id number]   Subjective:    Patient ID: Carol Vega, female    DOB: Sep 29, 1921, 82 y.o.   MRN: [id number]  HPI  Patient here for a scheduled follow up.  She is followed by Dr Ubaldo Glassing for hypertension, afib and CHF.  She is accompanied by her daughter.  History obtained from both of them.  She has been doing relatively well.  Does report problems with excessive saliva - sometimes unable to swallow.  No significant problems with food.  She is eating.  Discussed adding nutritional supplements to her diet.  No abdominal pain.  Bowels stable.  No increased heart rate or palpitations.     Past Medical History:  Diagnosis Date  . Aneurysm ([place])    inter atrial septum  . Atrial fibrillation ([place])   . CHF (congestive heart failure) ([facility])    h/o pleural effusions  . Diverticulosis   . Hypertension   . Osteoporosis   . Subdural hematoma [facility])    admitted 2007   Past Surgical History:  Procedure Laterality Date  . BREAST BIOPSY     benign  . TONSILECTOMY/ADENOIDECTOMY WITH MYRINGOTOMY  1930   Family History  Problem Relation Age of Onset  . Colon cancer Father    Social History   Social History  . Marital status: Widowed    Spouse name: N/A  . Number of children: 4  . Years of education: N/A   Social History Main Topics  . Smoking status: Never Smoker  . Smokeless tobacco: Never Used  . Alcohol use 0.0 oz/week     Comment: occasional glass of wine  . Drug use: No  . Sexual activity: Not Asked   Other Topics Concern  . None   Social History Narrative  . None    Outpatient Encounter Prescriptions as of 08/22/2016  Medication Sig  . Ascorbic Acid (VITAMIN C PO) Take by mouth.  Marland Kitchen CALCIUM-MAGNESIUM PO Take by mouth daily.  Marland Kitchen diltiazem (TIAZAC) 120 MG 24 hr capsule TAKE ONE (1) CAPSULE EACH DAY  . ferrous sulfate 325 (65 FE) MG tablet TAKE ONE (1) TABLET BY MOUTH 3 TIMES DAILY WITH FOOD  . losartan (COZAAR) 50 MG  tablet TAKE ONE (1) TABLET BY MOUTH EVERY DAY  . Multiple Vitamins-Minerals (PRESERVISION AREDS 2) CAPS Take by mouth 2 (two) times daily.  Marland Kitchen NEXIUM 40 MG capsule TAKE 1 CAPSULE EVERY DAY AT NOON   No facility-administered encounter medications on file as of 08/22/2016.     Review of Systems  Constitutional: Negative for appetite change and fever.  HENT: Negative for congestion and sinus pressure.   Respiratory: Negative for cough, chest tightness and shortness of breath.   Cardiovascular: Negative for chest pain, palpitations and leg swelling.  Gastrointestinal: Negative for abdominal pain, diarrhea, nausea and vomiting.       Swallowing issues as outlined.    Genitourinary: Negative for difficulty urinating and dysuria.  Musculoskeletal: Negative for back pain and joint swelling.  Skin: Negative for color change and rash.  Neurological: Negative for dizziness, light-headedness and headaches.  Psychiatric/Behavioral: Negative for agitation and dysphoric mood.       Objective:    Physical Exam  Constitutional: She appears well-developed and well-nourished. No distress.  HENT:  Nose: Nose normal.  Mouth/Throat: Oropharynx is clear and moist.  Neck: Neck supple. No thyromegaly present.  Cardiovascular: Normal rate and regular rhythm.   Pulmonary/Chest: Breath sounds normal.  No respiratory distress. She has no wheezes.  Abdominal: Soft. Bowel sounds are normal. There is no tenderness.  Musculoskeletal: She exhibits no edema or tenderness.  Lymphadenopathy:    She has no cervical adenopathy.  Skin: No rash noted. No erythema.  Psychiatric: She has a normal mood and affect. Her behavior is normal.    BP 112/60   Pulse 83   Temp 98.1 F (36.7 C) (Oral)   Ht 4\' 10"  (1.473 m)   Wt 92 lb 3.2 oz (41.8 kg)   SpO2 96%   BMI 19.27 kg/m  Wt Readings from Last 3 Encounters:  08/22/16 92 lb 3.2 oz (41.8 kg)  04/24/15 94 lb (42.6 kg)  04/24/15 94 lb (42.6 kg)     Lab Results    Component Value Date   WBC 7.6 08/19/2016   HGB 12.1 08/19/2016   HCT 35.8 (L) 08/19/2016   PLT 313.0 08/19/2016   GLUCOSE 111 (H) 08/19/2016   CHOL 164 08/19/2016   TRIG 147.0 08/19/2016   HDL 48.70 08/19/2016   LDLCALC 86 08/19/2016   ALT 13 08/19/2016   AST 17 08/19/2016   NA 135 08/19/2016   K 4.6 08/19/2016   CL 96 08/19/2016   CREATININE 1.00 08/19/2016   BUN 21 08/19/2016   CO2 32 08/19/2016   TSH 4.46 08/19/2016   INR 1.0 12/06/2011   HGBA1C 5.2 08/19/2016    Dg Chest 2 View  Result Date: 04/24/2015 CLINICAL DATA:  Shortness breath. Atrial fibrillation. Congestive heart failure. EXAM: CHEST  2 VIEW COMPARISON:  11/17/2013 FINDINGS: Moderate cardiomegaly is stable. Pulmonary hyperinflation is consistent with COPD. Chronic pulmonary vascular congestion is noted, however there is no evidence of acute infiltrate or pulmonary edema. No evidence of pleural effusion. Thoracic dextroscoliosis again noted. IMPRESSION: Moderate cardiomegaly and COPD.  No active lung disease. Electronically Signed   By: Earle Gell M.D.   On: 04/24/2015 21:20       Assessment & Plan:   Problem List Items Addressed This Visit    Anemia    Follow cbc.  Last hgb wnl.  Will change iron to qod.  Follow.        Relevant Orders   CBC with Differential/Platelet   Ferritin   Atrial fibrillation (HCC)    Rate controlled.  Doing well.  Followed by cardiology.       CHF (congestive heart failure) (HCC)    No evidence of volume overload.  Overall doing well.  Continue current medication regimen.  Follow.        Relevant Orders   Lipid panel   Hepatic function panel   Difficulty swallowing    Noticed some difficulty swallowing spit at times.  Question if issue with food.  Discussed with pt and her daughter.  Will obtain swallowing evaluation.        Hypertension    Blood pressure under good control.  Continue same medication regimen.  Follow pressures.  Follow metabolic panel.         Relevant Orders   Basic metabolic panel   Hyponatremia    Recent sodium wnl.  Follow.        Other Visit Diagnoses    Hyperglycemia    -  Primary   Relevant Orders   Hemoglobin A1c   Encounter for immunization       Relevant Orders   Flu vaccine HIGH DOSE PF (Completed)       Einar Pheasant, MD

## 2016-09-03 ENCOUNTER — Other Ambulatory Visit: Payer: Self-pay | Admitting: Internal Medicine

## 2016-09-04 ENCOUNTER — Encounter: Payer: Self-pay | Admitting: Internal Medicine

## 2016-09-04 DIAGNOSIS — R1310 Dysphagia, unspecified: Secondary | ICD-10-CM | POA: Insufficient documentation

## 2016-09-04 NOTE — Assessment & Plan Note (Signed)
Noticed some difficulty swallowing spit at times.  Question if issue with food.  Discussed with pt and her daughter.  Will obtain swallowing evaluation.

## 2016-09-04 NOTE — Assessment & Plan Note (Signed)
Recent sodium wnl.  Follow.

## 2016-09-04 NOTE — Assessment & Plan Note (Signed)
No evidence of volume overload.  Overall doing well.  Continue current medication regimen.  Follow.

## 2016-09-04 NOTE — Assessment & Plan Note (Signed)
Blood pressure under good control.  Continue same medication regimen.  Follow pressures.  Follow metabolic panel.   

## 2016-09-04 NOTE — Assessment & Plan Note (Signed)
Rate controlled.  Doing well.  Followed by cardiology.

## 2016-09-04 NOTE — Assessment & Plan Note (Addendum)
Follow cbc.  Last hgb wnl.  Will change iron to qod.  Follow.

## 2016-09-05 ENCOUNTER — Other Ambulatory Visit: Payer: Self-pay | Admitting: Internal Medicine

## 2016-09-15 ENCOUNTER — Telehealth: Payer: Self-pay | Admitting: Internal Medicine

## 2016-09-15 DIAGNOSIS — R1310 Dysphagia, unspecified: Secondary | ICD-10-CM

## 2016-09-15 NOTE — Telephone Encounter (Signed)
Pt daughter called about pt needing to get a referral for pt to get a swallow test done. Please advise?  Call daughter @ [phone]. Thank you!

## 2016-09-15 NOTE — Telephone Encounter (Signed)
Please advise 

## 2016-09-16 NOTE — Telephone Encounter (Signed)
Try     [id number]

## 2016-09-16 NOTE — Telephone Encounter (Signed)
This pt needs an upper GI with barium swallow to evaluate her swallowing.  How do I order this?  Thanks.

## 2016-09-17 NOTE — Telephone Encounter (Signed)
I have placed the order for the test.  I ordered the test that you suggested.  I could not tell if this was UGI with barium swallow.  Order placed.    Thanks   Dr Nicki Reaper

## 2016-09-23 ENCOUNTER — Ambulatory Visit
Admission: RE | Admit: 2016-09-23 | Discharge: 2016-09-23 | Disposition: A | Discharge status: Home / Self Care | Payer: Medicare Other | Source: Ambulatory Visit | Attending: Internal Medicine | Admitting: Internal Medicine

## 2016-09-23 DIAGNOSIS — R1310 Dysphagia, unspecified: Secondary | ICD-10-CM

## 2016-10-11 ENCOUNTER — Other Ambulatory Visit: Payer: Self-pay | Admitting: Internal Medicine

## 2016-10-21 ENCOUNTER — Ambulatory Visit: Payer: Medicare Other | Admitting: Podiatry

## 2016-10-25 ENCOUNTER — Telehealth: Payer: Self-pay | Admitting: Internal Medicine

## 2016-10-25 NOTE — Telephone Encounter (Signed)
Pt declined to get the AWV. Thank you! °

## 2016-10-27 ENCOUNTER — Ambulatory Visit (INDEPENDENT_AMBULATORY_CARE_PROVIDER_SITE_OTHER): Payer: Medicare Other | Admitting: Podiatry

## 2016-10-27 DIAGNOSIS — M79609 Pain in unspecified limb: Secondary | ICD-10-CM

## 2016-10-27 DIAGNOSIS — L603 Nail dystrophy: Secondary | ICD-10-CM

## 2016-10-27 DIAGNOSIS — B351 Tinea unguium: Secondary | ICD-10-CM

## 2016-10-27 DIAGNOSIS — L608 Other nail disorders: Secondary | ICD-10-CM

## 2016-10-27 NOTE — Progress Notes (Signed)
SUBJECTIVE Patient  presents to office today complaining of elongated, thickened nails. Pain while ambulating in shoes. Patient is unable to trim their own nails.   OBJECTIVE General Patient is awake, alert, and oriented x 3 and in no acute distress. Derm Skin is dry and supple bilateral. Negative open lesions or macerations. Remaining integument unremarkable. Nails are tender, long, thickened and dystrophic with subungual debris, consistent with onychomycosis, 1-5 bilateral. No signs of infection noted. Vasc  DP and PT pedal pulses palpable bilaterally. Temperature gradient within normal limits.  Neuro Epicritic and protective threshold sensation diminished bilaterally.  Musculoskeletal Exam No symptomatic pedal deformities noted bilateral. Muscular strength within normal limits.  ASSESSMENT 1. Onychodystrophic nails 1-5 bilateral with hyperkeratosis of nails.  2. Onychomycosis of nail due to dermatophyte bilateral 3. Pain in foot bilateral  PLAN OF CARE 1. Patient evaluated today.  2. Instructed to maintain good pedal hygiene and foot care.  3. Mechanical debridement of nails 1-5 bilaterally performed using a nail nipper. Filed with dremel without incident.  4. Return to clinic in 3 mos.    Demarko Zeimet M Rimsha Trembley, DPM    

## 2016-12-22 ENCOUNTER — Telehealth: Payer: Self-pay | Admitting: Internal Medicine

## 2016-12-22 NOTE — Telephone Encounter (Signed)
PA completed on 12/22/16 and sent to plan.

## 2017-01-04 DIAGNOSIS — I4891 Unspecified atrial fibrillation: Secondary | ICD-10-CM | POA: Diagnosis not present

## 2017-01-04 DIAGNOSIS — I10 Essential (primary) hypertension: Secondary | ICD-10-CM | POA: Diagnosis not present

## 2017-01-04 DIAGNOSIS — I5022 Chronic systolic (congestive) heart failure: Secondary | ICD-10-CM | POA: Diagnosis not present

## 2017-01-06 ENCOUNTER — Telehealth: Payer: Self-pay | Admitting: Internal Medicine

## 2017-01-06 NOTE — Telephone Encounter (Signed)
Called to speak with Patient DPR (daughter) concening PA for nexium was advised by daughter at last Cardiology visit nexium and all BP medications were D/C. Nexium and losartan.

## 2017-01-26 ENCOUNTER — Ambulatory Visit: Payer: Medicare Other | Admitting: Podiatry

## 2017-01-30 ENCOUNTER — Ambulatory Visit: Payer: Medicare Other | Admitting: Podiatry

## 2017-02-03 ENCOUNTER — Inpatient Hospital Stay
Admission: EM | Admit: 2017-02-03 | Discharge: 2017-02-08 | DRG: 292 | Disposition: A | Discharge status: Skilled Nursing Facility | Payer: Medicare Other | Attending: Internal Medicine | Admitting: Internal Medicine

## 2017-02-03 ENCOUNTER — Emergency Department: Payer: Medicare Other

## 2017-02-03 ENCOUNTER — Encounter: Payer: Self-pay | Admitting: Intensive Care

## 2017-02-03 DIAGNOSIS — Z681 Body mass index (BMI) 19 or less, adult: Secondary | ICD-10-CM

## 2017-02-03 DIAGNOSIS — S299XXA Unspecified injury of thorax, initial encounter: Secondary | ICD-10-CM | POA: Diagnosis not present

## 2017-02-03 DIAGNOSIS — N3000 Acute cystitis without hematuria: Secondary | ICD-10-CM | POA: Diagnosis not present

## 2017-02-03 DIAGNOSIS — I4891 Unspecified atrial fibrillation: Secondary | ICD-10-CM | POA: Diagnosis not present

## 2017-02-03 DIAGNOSIS — S59901A Unspecified injury of right elbow, initial encounter: Secondary | ICD-10-CM | POA: Diagnosis not present

## 2017-02-03 DIAGNOSIS — S0990XA Unspecified injury of head, initial encounter: Secondary | ICD-10-CM | POA: Diagnosis present

## 2017-02-03 DIAGNOSIS — I5031 Acute diastolic (congestive) heart failure: Secondary | ICD-10-CM | POA: Diagnosis present

## 2017-02-03 DIAGNOSIS — S32501A Unspecified fracture of right pubis, initial encounter for closed fracture: Secondary | ICD-10-CM | POA: Diagnosis not present

## 2017-02-03 DIAGNOSIS — W1830XA Fall on same level, unspecified, initial encounter: Secondary | ICD-10-CM | POA: Diagnosis present

## 2017-02-03 DIAGNOSIS — W19XXXA Unspecified fall, initial encounter: Secondary | ICD-10-CM | POA: Diagnosis not present

## 2017-02-03 DIAGNOSIS — Z8261 Family history of arthritis: Secondary | ICD-10-CM

## 2017-02-03 DIAGNOSIS — R000 Tachycardia, unspecified: Secondary | ICD-10-CM | POA: Diagnosis present

## 2017-02-03 DIAGNOSIS — M25531 Pain in right wrist: Secondary | ICD-10-CM | POA: Diagnosis not present

## 2017-02-03 DIAGNOSIS — S329XXA Fracture of unspecified parts of lumbosacral spine and pelvis, initial encounter for closed fracture: Secondary | ICD-10-CM | POA: Diagnosis not present

## 2017-02-03 DIAGNOSIS — S6991XA Unspecified injury of right wrist, hand and finger(s), initial encounter: Secondary | ICD-10-CM | POA: Diagnosis not present

## 2017-02-03 DIAGNOSIS — S199XXA Unspecified injury of neck, initial encounter: Secondary | ICD-10-CM | POA: Diagnosis not present

## 2017-02-03 DIAGNOSIS — R64 Cachexia: Secondary | ICD-10-CM | POA: Diagnosis present

## 2017-02-03 DIAGNOSIS — M810 Age-related osteoporosis without current pathological fracture: Secondary | ICD-10-CM | POA: Diagnosis not present

## 2017-02-03 DIAGNOSIS — S79911A Unspecified injury of right hip, initial encounter: Secondary | ICD-10-CM | POA: Diagnosis not present

## 2017-02-03 DIAGNOSIS — M25551 Pain in right hip: Secondary | ICD-10-CM | POA: Diagnosis not present

## 2017-02-03 DIAGNOSIS — Z23 Encounter for immunization: Secondary | ICD-10-CM

## 2017-02-03 DIAGNOSIS — D649 Anemia, unspecified: Secondary | ICD-10-CM | POA: Diagnosis present

## 2017-02-03 DIAGNOSIS — R0902 Hypoxemia: Secondary | ICD-10-CM | POA: Diagnosis present

## 2017-02-03 DIAGNOSIS — I110 Hypertensive heart disease with heart failure: Secondary | ICD-10-CM | POA: Diagnosis not present

## 2017-02-03 DIAGNOSIS — R0789 Other chest pain: Secondary | ICD-10-CM | POA: Diagnosis present

## 2017-02-03 DIAGNOSIS — Z800 Family history of malignant neoplasm of digestive organs: Secondary | ICD-10-CM

## 2017-02-03 DIAGNOSIS — F0390 Unspecified dementia without behavioral disturbance: Secondary | ICD-10-CM | POA: Diagnosis present

## 2017-02-03 DIAGNOSIS — M25521 Pain in right elbow: Secondary | ICD-10-CM | POA: Diagnosis not present

## 2017-02-03 DIAGNOSIS — I509 Heart failure, unspecified: Secondary | ICD-10-CM

## 2017-02-03 DIAGNOSIS — Y92002 Bathroom of unspecified non-institutional (private) residence single-family (private) house as the place of occurrence of the external cause: Secondary | ICD-10-CM

## 2017-02-03 DIAGNOSIS — R42 Dizziness and giddiness: Secondary | ICD-10-CM | POA: Diagnosis not present

## 2017-02-03 DIAGNOSIS — Z79899 Other long term (current) drug therapy: Secondary | ICD-10-CM

## 2017-02-03 DIAGNOSIS — L8990 Pressure ulcer of unspecified site, unspecified stage: Secondary | ICD-10-CM | POA: Insufficient documentation

## 2017-02-03 DIAGNOSIS — S51011A Laceration without foreign body of right elbow, initial encounter: Secondary | ICD-10-CM | POA: Diagnosis present

## 2017-02-03 LAB — CBC
HEMATOCRIT: 34.8 % — AB (ref 35.0–47.0)
HEMOGLOBIN: 11.8 g/dL — AB (ref 12.0–16.0)
MCH: 33.4 pg (ref 26.0–34.0)
MCHC: 34 g/dL (ref 32.0–36.0)
MCV: 98.2 fL (ref 80.0–100.0)
Platelets: 260 10*3/uL (ref 150–440)
RBC: 3.55 MIL/uL — ABNORMAL LOW (ref 3.80–5.20)
RDW: 13.2 % (ref 11.5–14.5)
WBC: 9.3 10*3/uL (ref 3.6–11.0)

## 2017-02-03 LAB — URINALYSIS, COMPLETE (UACMP) WITH MICROSCOPIC
Bilirubin Urine: NEGATIVE
GLUCOSE, UA: NEGATIVE mg/dL
HGB URINE DIPSTICK: NEGATIVE
Ketones, ur: NEGATIVE mg/dL
NITRITE: NEGATIVE
Protein, ur: 100 mg/dL — AB
SPECIFIC GRAVITY, URINE: 1.018 (ref 1.005–1.030)
Squamous Epithelial / LPF: NONE SEEN
pH: 7 (ref 5.0–8.0)

## 2017-02-03 LAB — BASIC METABOLIC PANEL
Anion gap: 7 (ref 5–15)
BUN: 40 mg/dL — ABNORMAL HIGH (ref 6–20)
CALCIUM: 9.3 mg/dL (ref 8.9–10.3)
CO2: 33 mmol/L — AB (ref 22–32)
Chloride: 98 mmol/L — ABNORMAL LOW (ref 101–111)
Creatinine, Ser: 0.97 mg/dL (ref 0.44–1.00)
GFR, EST AFRICAN AMERICAN: 57 mL/min — AB (ref >60)
GFR, EST NON AFRICAN AMERICAN: 49 mL/min — AB (ref >60)
Glucose, Bld: 140 mg/dL — ABNORMAL HIGH (ref 65–99)
POTASSIUM: 4.4 mmol/L (ref 3.5–5.1)
Sodium: 138 mmol/L (ref 135–145)

## 2017-02-03 LAB — TROPONIN I: Troponin I: <0.03 ng/mL (ref <0.03)

## 2017-02-03 MED ORDER — CEPHALEXIN 500 MG PO CAPS
500.0000 mg | ORAL_CAPSULE | Freq: Once | ORAL | Status: AC
  Administered 2017-02-03: 500 mg via ORAL
  Filled 2017-02-03: qty 1

## 2017-02-03 MED ORDER — KETOROLAC TROMETHAMINE 15 MG/ML IJ SOLN: 15.0000 mg | Freq: Four times a day (QID) | INTRAMUSCULAR | Status: DC | PRN

## 2017-02-03 MED ORDER — ACETAMINOPHEN 325 MG PO TABS
650.0000 mg | ORAL_TABLET | Freq: Four times a day (QID) | ORAL | Status: DC | PRN
  Administered 2017-02-05: 650 mg via ORAL
  Filled 2017-02-03: qty 2

## 2017-02-03 MED ORDER — ONDANSETRON HCL 4 MG/2ML IJ SOLN: 4.0000 mg | Freq: Four times a day (QID) | INTRAMUSCULAR | Status: DC | PRN

## 2017-02-03 MED ORDER — TETANUS-DIPHTH-ACELL PERTUSSIS 5-2.5-18.5 LF-MCG/0.5 IM SUSP
0.5000 mL | Freq: Once | INTRAMUSCULAR | Status: AC
  Administered 2017-02-03: 0.5 mL via INTRAMUSCULAR
  Filled 2017-02-03: qty 0.5

## 2017-02-03 MED ORDER — ACETAMINOPHEN 650 MG RE SUPP: 650.0000 mg | Freq: Four times a day (QID) | RECTAL | Status: DC | PRN

## 2017-02-03 MED ORDER — ONDANSETRON HCL 4 MG PO TABS: 4.0000 mg | ORAL_TABLET | Freq: Four times a day (QID) | ORAL | Status: DC | PRN

## 2017-02-03 MED ORDER — CHOLECALCIFEROL 10 MCG (400 UNIT) PO TABS
400.0000 [IU] | ORAL_TABLET | Freq: Every day | ORAL | Status: DC
  Administered 2017-02-03 – 2017-02-08 (×6): 400 [IU] via ORAL
  Filled 2017-02-03 (×7): qty 1

## 2017-02-03 MED ORDER — CIPROFLOXACIN HCL 500 MG PO TABS
250.0000 mg | ORAL_TABLET | Freq: Two times a day (BID) | ORAL | Status: DC
  Administered 2017-02-03: 250 mg via ORAL
  Filled 2017-02-03: qty 2

## 2017-02-03 MED ORDER — CALCIUM CARBONATE ANTACID 500 MG PO CHEW
1.0000 | CHEWABLE_TABLET | Freq: Every day | ORAL | Status: DC
  Administered 2017-02-04 – 2017-02-08 (×4): 200 mg via ORAL
  Filled 2017-02-03 (×4): qty 1

## 2017-02-03 MED ORDER — DILTIAZEM HCL ER COATED BEADS 120 MG PO CP24
120.0000 mg | ORAL_CAPSULE | Freq: Every day | ORAL | Status: DC
  Administered 2017-02-04 – 2017-02-06 (×2): 120 mg via ORAL
  Filled 2017-02-03 (×3): qty 1

## 2017-02-03 MED ORDER — HYDROCODONE-ACETAMINOPHEN 5-325 MG PO TABS
1.0000 | ORAL_TABLET | ORAL | Status: DC | PRN
  Administered 2017-02-04 – 2017-02-08 (×5): 1 via ORAL
  Filled 2017-02-03 (×5): qty 1

## 2017-02-03 MED ORDER — OMEGA-3-ACID ETHYL ESTERS 1 G PO CAPS
1.0000 g | ORAL_CAPSULE | Freq: Every day | ORAL | Status: DC
  Administered 2017-02-03 – 2017-02-07 (×3): 1 g via ORAL
  Filled 2017-02-03 (×4): qty 1

## 2017-02-03 MED ORDER — FERROUS SULFATE 325 (65 FE) MG PO TABS
325.0000 mg | ORAL_TABLET | Freq: Three times a day (TID) | ORAL | Status: DC
  Administered 2017-02-04 – 2017-02-08 (×12): 325 mg via ORAL
  Filled 2017-02-03 (×13): qty 1

## 2017-02-03 MED ORDER — OXYCODONE HCL 5 MG PO TABS
5.0000 mg | ORAL_TABLET | Freq: Once | ORAL | Status: AC
Start: 2017-02-03 — End: 2017-02-03
  Administered 2017-02-03: 5 mg via ORAL
  Filled 2017-02-03: qty 1

## 2017-02-03 MED ORDER — FENTANYL CITRATE (PF) 100 MCG/2ML IJ SOLN
12.5000 ug | Freq: Once | INTRAMUSCULAR | Status: AC
  Administered 2017-02-03: 12.5 ug via INTRAVENOUS
  Filled 2017-02-03: qty 2

## 2017-02-03 MED ORDER — ENOXAPARIN SODIUM 30 MG/0.3ML ~~LOC~~ SOLN
30.0000 mg | SUBCUTANEOUS | Status: DC
  Administered 2017-02-03 – 2017-02-07 (×5): 30 mg via SUBCUTANEOUS
  Filled 2017-02-03 (×5): qty 0.3

## 2017-02-03 MED ORDER — ACETAMINOPHEN 500 MG PO TABS
1000.0000 mg | ORAL_TABLET | Freq: Once | ORAL | Status: AC
  Administered 2017-02-03: 1000 mg via ORAL
  Filled 2017-02-03: qty 2

## 2017-02-03 NOTE — ED Notes (Signed)
Attempted report. RN reports she is in a isolation room and cannot take report. She will call me back when ready

## 2017-02-03 NOTE — H&P (Signed)
[facility] NAME: Carol Vega    MR#:  [id number]  DATE OF BIRTH:  [birth year]  DATE OF ADMISSION:  02/03/2017  PRIMARY CARE PHYSICIAN: Einar Pheasant, MD   REQUESTING/REFERRING PHYSICIAN: Dr. Gonzella Lex  CHIEF COMPLAINT:   Chief Complaint  Patient presents with  . Fall  Pelvic Fracture  HISTORY OF PRESENT ILLNESS:  Carol Vega  is a 83 y.o. female with a known history of Atrial fibrillation, hypertension, osteoporosis, previous history of subdural hematoma, history of CHF who presents to the hospital due to a fall and noted to have a right-sided pelvic fracture. Patient herself has mild dementia and therefore is a very poor historian therefore most of the history obtained from the daughter at bedside. As per the daughter patient does ambulate at home holding Onto the walls and furniture but does not use a walker or cane. She has not had a fall recently. Today she was ambulating to the bathroom and fell and was having significant pain and therefore brought to the ER for further evaluation. Patient underwent a trauma workup including a CT of the head and cervical spine also x-rays which showed a pelvic fracture on the right side and the hospitalist services were contacted further treatment and evaluation. Patient was given some pain control but despite that she could not ambulate with the help of 2 people.  She is being admitted under observation.  PAST MEDICAL HISTORY:   Past Medical History:  Diagnosis Date  . Aneurysm ([place])    inter atrial septum  . Atrial fibrillation ([place])   . CHF (congestive heart failure) (HCC)    h/o pleural effusions  . Diverticulosis   . Hypertension   . Osteoporosis   . Subdural hematoma [facility])    admitted 2007    PAST SURGICAL HISTORY:   Past Surgical History:  Procedure Laterality Date  . BREAST BIOPSY     benign  . TONSILECTOMY/ADENOIDECTOMY WITH MYRINGOTOMY  1930    SOCIAL HISTORY:    Social History  Substance Use Topics  . Smoking status: Never Smoker  . Smokeless tobacco: Never Used  . Alcohol use 0.0 oz/week     Comment: occasional glass of wine    FAMILY HISTORY:   Family History  Problem Relation Age of Onset  . Colon cancer Father   . Rheum arthritis Mother     DRUG ALLERGIES:  No Known Allergies  REVIEW OF SYSTEMS:   Review of Systems  Constitutional: Negative for fever and weight loss.  HENT: Negative for congestion, nosebleeds and tinnitus.   Eyes: Negative for blurred vision, double vision and redness.  Respiratory: Negative for cough, hemoptysis and shortness of breath.   Cardiovascular: Negative for chest pain, orthopnea, leg swelling and PND.  Gastrointestinal: Negative for abdominal pain, diarrhea, melena, nausea and vomiting.  Genitourinary: Negative for dysuria, hematuria and urgency.  Musculoskeletal: Positive for falls and joint pain (Right Hip/pelvis).  Neurological: Positive for weakness (Generalized. ). Negative for dizziness, tingling, sensory change, focal weakness, seizures and headaches.  Endo/Heme/Allergies: Negative for polydipsia. Does not bruise/bleed easily.  Psychiatric/Behavioral: Negative for depression and memory loss. The patient is not nervous/anxious.     MEDICATIONS AT HOME:   Prior to Admission medications   Medication Sig Start Date End Date Taking? Authorizing Provider  Ascorbic Acid (VITAMIN C PO) Take by mouth.   Yes Historical Provider, MD  CALCIUM-MAGNESIUM PO Take by mouth daily.   Yes Historical Provider, MD  Cholecalciferol (  VITAMIN D PO) Take 1 tablet by mouth daily.   Yes Historical Provider, MD  Cyanocobalamin (VITAMIN B-12 PO) Take 1 Dose by mouth daily.   Yes Historical Provider, MD  diltiazem (CARDIZEM CD) 120 MG 24 hr capsule TAKE ONE CAPSULE BY MOUTH DAILY 09/05/16  Yes Einar Pheasant, MD  ferrous sulfate 325 (65 FE) MG tablet TAKE ONE (1) TABLET BY MOUTH 3 TIMES DAILY WITH FOOD 08/11/16  Yes  Einar Pheasant, MD  Multiple Vitamins-Minerals (PRESERVISION AREDS 2) CAPS Take by mouth 2 (two) times daily.   Yes Historical Provider, MD  Omega-3 Fatty Acids (FISH OIL) 1000 MG CAPS Take 1 capsule by mouth daily.   Yes Historical Provider, MD  losartan (COZAAR) 50 MG tablet TAKE ONE (1) TABLET BY MOUTH EVERY DAY Patient not taking: Reported on 02/03/2017 09/05/16   Einar Pheasant, MD  NEXIUM 40 MG capsule TAKE ONE CAPSULE EVERY DAY AT NOON. Patient not taking: Reported on 02/03/2017 10/11/16   Einar Pheasant, MD      VITAL SIGNS:  Blood pressure (!) 96/57, pulse 86, temperature 97.6 F (36.4 C), temperature source Oral, resp. rate (!) 24, height 5' (1.524 m), weight 41.7 kg (91 lb 14.4 oz), SpO2 96 %.  PHYSICAL EXAMINATION:  Physical Exam  GENERAL:  83 y.o.-year-old cachectic patient lying in bed in no acute distress.  EYES: Pupils equal, round, reactive to light and accommodation. No scleral icterus. Extraocular muscles intact.  HEENT: Head atraumatic, normocephalic. Oropharynx and nasopharynx clear. No oropharyngeal erythema, moist oral mucosa  NECK:  Supple, no jugular venous distention. No thyroid enlargement, no tenderness.  LUNGS: Normal breath sounds bilaterally, no wheezing, rales, rhonchi. No use of accessory muscles of respiration.  CARDIOVASCULAR: S1, S2 RRR. No murmurs, rubs, gallops, clicks.  ABDOMEN: Soft, nontender, nondistended. Bowel sounds present. No organomegaly or mass.  EXTREMITIES: No pedal edema, cyanosis, or clubbing. + 2 pedal & radial pulses b/l.   NEUROLOGIC: Cranial nerves II through XII are intact. No focal Motor or sensory deficits appreciated b/l. Globally weak.  PSYCHIATRIC: The patient is alert and oriented x 1.  SKIN: No obvious rash, lesion, or ulcer.   LABORATORY PANEL:   CBC  Recent Labs Lab 02/03/17 0855  WBC 9.3  HGB 11.8*  HCT 34.8*  PLT 260    ------------------------------------------------------------------------------------------------------------------  Chemistries   Recent Labs Lab 02/03/17 0855  NA 138  K 4.4  CL 98*  CO2 33*  GLUCOSE 140*  BUN 40*  CREATININE 0.97  CALCIUM 9.3   ------------------------------------------------------------------------------------------------------------------  Cardiac Enzymes  Recent Labs Lab 02/03/17 0855  TROPONINI <0.03   ------------------------------------------------------------------------------------------------------------------  RADIOLOGY:  Dg Ribs Unilateral W/chest Right  Result Date: 02/03/2017 CLINICAL DATA:  Dizziness this morning followed by a fall. Multiple sites of pain. EXAM: RIGHT RIBS AND CHEST - 3+ VIEW COMPARISON:  04/24/2015. FINDINGS: Stable enlarged cardiac silhouette. Aortic calcification. Stable prominence of the interstitial markings and hyperexpansion of the lungs. Interval small amount of airspace opacity at the left lateral lung base and possible small bilateral pleural effusions. Diffuse osteopenia. No rib fracture or pneumothorax seen. IMPRESSION: 1. No fracture pneumothorax seen. 2. Stable cardiomegaly and changes of COPD. 3. Interval probable atelectasis at the left lateral lung base and possible small bilateral pleural effusions. 4. Aortic atherosclerosis. Electronically Signed   By: Claudie Revering M.D.   On: 02/03/2017 10:36   Dg Elbow Complete Right  Result Date: 02/03/2017 CLINICAL DATA:  Fall.  Elbow pain. EXAM: RIGHT ELBOW - COMPLETE 3+ VIEW COMPARISON:  None.  FINDINGS: No acute bony abnormality. Specifically, no fracture, subluxation, or dislocation. Soft tissues are intact. No joint effusion. IMPRESSION: No acute bony abnormality. Electronically Signed   By: Rolm Baptise M.D.   On: 02/03/2017 10:28   Dg Wrist Complete Right  Result Date: 02/03/2017 CLINICAL DATA:  Fall, wrist pain EXAM: RIGHT WRIST - COMPLETE 3+ VIEW COMPARISON:   None. FINDINGS: No acute bony abnormality. Specifically, no fracture, subluxation, or dislocation. Soft tissues are intact. Degenerative changes at the first carpometacarpal joint. IMPRESSION: No acute bony abnormality. Electronically Signed   By: Rolm Baptise M.D.   On: 02/03/2017 10:28   Ct Head Wo Contrast  Result Date: 02/03/2017 CLINICAL DATA:  Recent fall this morning EXAM: CT HEAD WITHOUT CONTRAST CT CERVICAL SPINE WITHOUT CONTRAST TECHNIQUE: Multidetector CT imaging of the head and cervical spine was performed following the standard protocol without intravenous contrast. Multiplanar CT image reconstructions of the cervical spine were also generated. COMPARISON:  02/15/2016 FINDINGS: CT HEAD FINDINGS Brain: Diffuse atrophic changes and chronic white matter ischemic change is noted. No findings to suggest acute hemorrhage, acute infarction or space-occupying mass lesion are noted. Vascular: No hyperdense vessel or unexpected calcification. Skull: Postsurgical changes are noted in the right frontoparietal region. Sinuses/Orbits: No acute finding. Other: None. CT CERVICAL SPINE FINDINGS Alignment: Mild anterolisthesis of C4 on C5 is noted of a degenerative nature. Skull base and vertebrae: 7 cervical segments are well visualized. Vertebral body height is well maintained. Osteophytic changes are noted at all levels from C4-C7. Facet hypertrophic changes are noted at multiple levels. No acute fracture or acute facet abnormality is noted. Scoliosis of the cervicothoracic spine is noted. Soft tissues and spinal canal: No prevertebral fluid or swelling. No visible canal hematoma. Disc levels:  Multilevel disc space narrowing is noted from C3-T1. Upper chest: Biapical scarring is noted. IMPRESSION: CT of the head: Atrophic and ischemic changes as well as postoperative changes on the right. No acute abnormality noted. CT of the cervical spine: Multilevel degenerative change without acute abnormality. Electronically  Signed   By: Inez Catalina M.D.   On: 02/03/2017 10:05   Ct Cervical Spine Wo Contrast  Result Date: 02/03/2017 CLINICAL DATA:  Recent fall this morning EXAM: CT HEAD WITHOUT CONTRAST CT CERVICAL SPINE WITHOUT CONTRAST TECHNIQUE: Multidetector CT imaging of the head and cervical spine was performed following the standard protocol without intravenous contrast. Multiplanar CT image reconstructions of the cervical spine were also generated. COMPARISON:  02/15/2016 FINDINGS: CT HEAD FINDINGS Brain: Diffuse atrophic changes and chronic white matter ischemic change is noted. No findings to suggest acute hemorrhage, acute infarction or space-occupying mass lesion are noted. Vascular: No hyperdense vessel or unexpected calcification. Skull: Postsurgical changes are noted in the right frontoparietal region. Sinuses/Orbits: No acute finding. Other: None. CT CERVICAL SPINE FINDINGS Alignment: Mild anterolisthesis of C4 on C5 is noted of a degenerative nature. Skull base and vertebrae: 7 cervical segments are well visualized. Vertebral body height is well maintained. Osteophytic changes are noted at all levels from C4-C7. Facet hypertrophic changes are noted at multiple levels. No acute fracture or acute facet abnormality is noted. Scoliosis of the cervicothoracic spine is noted. Soft tissues and spinal canal: No prevertebral fluid or swelling. No visible canal hematoma. Disc levels:  Multilevel disc space narrowing is noted from C3-T1. Upper chest: Biapical scarring is noted. IMPRESSION: CT of the head: Atrophic and ischemic changes as well as postoperative changes on the right. No acute abnormality noted. CT of the cervical spine: Multilevel  degenerative change without acute abnormality. Electronically Signed   By: Inez Catalina M.D.   On: 02/03/2017 10:05   Dg Hip Unilat W Or Wo Pelvis 2-3 Views Right  Result Date: 02/03/2017 CLINICAL DATA:  Multiple sites of pain following a fall this morning, including the right hip.  EXAM: DG HIP (WITH OR WITHOUT PELVIS) 2-3V RIGHT COMPARISON:  None. FINDINGS: Diffuse osteopenia. Minimal femoral head and neck junction spur formation on the right without hip fracture or dislocation. Possible nondisplaced fracture of the right pubic bone. Mild aortic calcification. IMPRESSION: Possible nondisplaced fracture of the right pubic bone, age indeterminate. No hip fracture or dislocation. Mild aortic atherosclerosis. Electronically Signed   By: Claudie Revering M.D.   On: 02/03/2017 10:33     IMPRESSION AND PLAN:   83 year old female with past medical history of osteoporosis, HTN, diverticulosis, atrial fibrillation, previous history of congestive heart failure, previous history of subdural hematoma who presented to the hospital a mechanical fall and noted to have a right pelvic fracture.  1. Status post fall and right-sided pelvic fracture-patient is in significant pain despite getting multiple doses of IV pain medications in the ER. -We'll admit her under observation for pain control and get a physical therapy evaluation. -Follow clinically, she likely Carol need home health services upon discharge.  2. History of atrial fibrillation-currently rate controlled. Continue Cardizem.  3. Chronic anemia-continue iron supplements.  4. Osteoporosis-continue calcium supplements.    All the records are reviewed and case discussed with ED provider. Management plans discussed with the patient, family and they are in agreement.  CODE STATUS: Full code  TOTAL TIME TAKING CARE OF THIS PATIENT: 45 minutes.    Henreitta Leber M.D on 02/03/2017 at 2:50 PM  Between 7am to 6pm - Pager - [phone]  After 6pm go to [email or web address] - password [email or web address] [facility]  Office  [phone]  CC: Primary care physician; Einar Pheasant, MD

## 2017-02-03 NOTE — ED Provider Notes (Signed)
[facility] Emergency Department Provider Note  ____________________________________________  Time seen: Approximately 8:49 AM  I have reviewed the triage vital signs and the nursing notes.   HISTORY  Chief Complaint Fall  Level 5 caveat:  Portions of the history and physical were unable to be obtained due to dementia   HPI Carol Vega is a 83 y.o. female history of atrial fibrillation, CHF, subdural hematoma, hypertension, GI bleed, and dementia who presents for evaluation after fall. History is limited due to patient's dementia. Patient tells me that she was walking to the bathroom and she doesn't know if she felt dizzy or if she tripped but she remembers falling. She is complaining of pain in her right chest wall, right wrist, right hip. She also sustained head trauma and does not remember if she had LOC. She denies headache, abdominal pain, leg pain. Not on blood thinners.   Past Medical History:  Diagnosis Date  . Aneurysm ([place])    inter atrial septum  . Atrial fibrillation ([place])   . CHF (congestive heart failure) (HCC)    h/o pleural effusions  . Diverticulosis   . Hypertension   . Osteoporosis   . Subdural hematoma [facility])    admitted 2007    Patient Active Problem List   Diagnosis Date Noted  . Difficulty swallowing 09/04/2016  . SOB (shortness of breath) 04/24/2015  . Health care maintenance 01/26/2015  . GI bleed 12/08/2013  . CHF (congestive heart failure) ([place]) 12/08/2013  . Hyponatremia 02/10/2013  . Atrial fibrillation ([place]) 09/30/2012  . Hypertension 09/30/2012  . 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 Osteoporosis 09/30/2012  . Anemia 09/30/2012    Past Surgical History:  Procedure Laterality Date  . BREAST BIOPSY     benign  . TONSILECTOMY/ADENOIDECTOMY WITH MYRINGOTOMY  1930    Prior to Admission medications   Medication Sig Start Date End Date Taking? Authorizing Provider  Ascorbic  Acid (VITAMIN C PO) Take by mouth.   Yes Historical Provider, MD  CALCIUM-MAGNESIUM PO Take by mouth daily.   Yes Historical Provider, MD  Cholecalciferol (VITAMIN D PO) Take 1 tablet by mouth daily.   Yes Historical Provider, MD  Cyanocobalamin (VITAMIN B-12 PO) Take 1 Dose by mouth daily.   Yes Historical Provider, MD  diltiazem (CARDIZEM CD) 120 MG 24 hr capsule TAKE ONE CAPSULE BY MOUTH DAILY 09/05/16  Yes Einar Pheasant, MD  ferrous sulfate 325 (65 FE) MG tablet TAKE ONE (1) TABLET BY MOUTH 3 TIMES DAILY WITH FOOD 08/11/16  Yes Einar Pheasant, MD  Multiple Vitamins-Minerals (PRESERVISION AREDS 2) CAPS Take by mouth 2 (two) times daily.   Yes Historical Provider, MD  Omega-3 Fatty Acids (FISH OIL) 1000 MG CAPS Take 1 capsule by mouth daily.   Yes Historical Provider, MD  losartan (COZAAR) 50 MG tablet TAKE ONE (1) TABLET BY MOUTH EVERY DAY Patient not taking: Reported on 02/03/2017 09/05/16   Einar Pheasant, MD  NEXIUM 40 MG capsule TAKE ONE CAPSULE EVERY DAY AT NOON. Patient not taking: Reported on 02/03/2017 10/11/16   Einar Pheasant, MD    Allergies Patient has no known allergies.  Family History  Problem Relation Age of Onset  . Colon cancer Father     Social History Social History  Substance Use Topics  . Smoking status: Never Smoker  . Smokeless tobacco: Never Used  . Alcohol use 0.0 oz/week     Comment: occasional glass of wine    Review of  Systems Constitutional: Negative for fever. Eyes: Negative for visual changes. ENT: Negative for facial injury or neck injury Cardiovascular: Negative for chest injury. Respiratory: Negative for shortness of breath. + chest wall injury. Gastrointestinal: Negative for abdominal pain or injury. Genitourinary: Negative for dysuria. Musculoskeletal: Negative for back injury. + R wrist, R hip pain. Skin: + RUE skin tear Neurological: + head injury.   ____________________________________________   PHYSICAL EXAM:  VITAL SIGNS: ED  Triage Vitals  Enc Vitals Group     BP 02/03/17 0844 115/72     Pulse Rate 02/03/17 0844 95     Resp 02/03/17 0844 20     Temp 02/03/17 0844 97.6 F (36.4 C)     Temp Source 02/03/17 0844 Oral     SpO2 02/03/17 0844 96 %     Weight 02/03/17 0848 91 lb 14.4 oz (41.7 kg)     Height 02/03/17 0848 5' (1.524 m)     Head Circumference --      Peak Flow --      Pain Score --      Pain Loc --      Pain Edu? --      Excl. in [place]? --    Constitutional: Alert and oriented. No acute distress. Does not appear intoxicated. HEENT Head: Normocephalic, R forehead hematoma. Face: No facial bony tenderness. Stable midface Ears: No hemotympanum bilaterally. No Battle sign Eyes: No eye injury. PERRL. No raccoon eyes Nose: Nontender. No epistaxis. No rhinorrhea Mouth/Throat: Mucous membranes are moist. No oropharyngeal blood. No dental injury. Airway patent without stridor. Normal voice. Neck: no C-collar in place. No midline c-spine tenderness.  Cardiovascular: Normal rate, regular rhythm. Normal and symmetric distal pulses are present in all extremities. Pulmonary/Chest: Chest wall is stable, ttp over the R chest wall, no crepitus. Normal respiratory effort. Breath sounds are normal. No crepitus.  Abdominal: Soft, nontender, non distended. Musculoskeletal: ttp over the distal radius on the R with no deformities, full ROM of R wrist. Tenderness with ROM of the R hip, no deformities. Nontender with normal full range of motion in all other extremities. No deformities. No thoracic or lumbar midline spinal tenderness. Pelvis is stable. Skin: Skin is warm, dry and intact. Skin tear over the R elbow region. Psychiatric: Speech and behavior are appropriate. Neurological: Normal speech and language. Moves all extremities to command. No [place] focal neurologic deficits are appreciated.  Glascow Coma Score: 4 - Opens eyes on own 6 - Follows simple motor commands 4 - Seems confused, disoriented GCS:  14   ____________________________________________   LABS (all labs ordered are listed, but only abnormal results are displayed)  Labs Reviewed  CBC - Abnormal; Notable for the following:       Result Value   RBC 3.55 (*)    Hemoglobin 11.8 (*)    HCT 34.8 (*)    All other components within normal limits  BASIC METABOLIC PANEL - Abnormal; Notable for the following:    Chloride 98 (*)    CO2 33 (*)    Glucose, Bld 140 (*)    BUN 40 (*)    GFR calc non Af Amer 49 (*)    GFR calc Af Amer 57 (*)    All other components within normal limits  URINALYSIS, COMPLETE (UACMP) WITH MICROSCOPIC - Abnormal; Notable for the following:    Color, Urine YELLOW (*)    APPearance CLOUDY (*)    Protein, ur 100 (*)    Leukocytes, UA MODERATE (*)  Bacteria, UA MANY (*)    All other components within normal limits  TROPONIN I   ____________________________________________  EKG  ED ECG REPORT I, Rudene Re, the attending physician, personally viewed and interpreted this ECG.   Atrial fibrillation, rate of 95, normal QRS and QTc intervals, normal axis, no ST elevations or depressions. ____________________________________________  RADIOLOGY  CT head: Negative   XR R wrist and elbow: Negative  XR pelvis: Possible nondisplaced fracture of the right pubic bone, age indeterminate. No hip fracture or dislocation. Mild aortic atherosclerosis. ____________________________________________   PROCEDURES  Procedure(s) performed: None Procedures Critical Care performed:  None ____________________________________________   INITIAL IMPRESSION / ASSESSMENT AND PLAN / ED COURSE  83 y.o. female history of atrial fibrillation, CHF, subdural hematoma, hypertension, GI bleed, and dementia who presents for evaluation after unwitnessed fall maybe preceded by dizziness. Plan for head CT, CT c-spine, XR of R hip, R elbow, R wrist, and R chest wall. Since patient has dementia and circumstances  leading to fall are unclear will check basic blood work, UA, EKG, troponin to rule out possible causes of syncope such as anemia, electrolyte abnormalities, dehydration, UTI, cardiac event. Will monitor on telemetry.  Clinical Course as of Feb 03 1421  Fri Feb 03, 2017  1419 Patient found to have a UTI with normal white count. She was started on Keflex. Imaging showing pelvic fracture but no hip fracture. Remainder of her image with no acute findings. Patient received Tylenol, oxycodone, and that note and as been unable to ambulate due to pain. Patient be admitted to the hospitalist service.  [CV]    Clinical Course User Index [CV] Rudene Re, MD    Pertinent labs & imaging results that were available during my care of the patient were reviewed by me and considered in my medical decision making (see chart for details).    ____________________________________________   FINAL CLINICAL IMPRESSION(S) / ED DIAGNOSES  Final diagnoses:  Fall, initial encounter  Closed nondisplaced fracture of pelvis, unspecified part of pelvis, initial encounter ([place])  Acute cystitis without hematuria      NEW MEDICATIONS STARTED DURING THIS VISIT:  New Prescriptions   No medications on file     Note:  This document was prepared using Dragon voice recognition software and may include unintentional dictation errors.    Rudene Re, MD 02/03/17 [phone]

## 2017-02-03 NOTE — Progress Notes (Signed)
Anticoagulation monitoring(Lovenox):  83 yo female ordered Lovenox 40 mg Q24h  Filed Weights   02/03/17 0848  Weight: 91 lb 14.4 oz (41.7 kg)   BMI    Lab Results  Component Value Date   CREATININE 0.97 02/03/2017   CREATININE 1.00 08/19/2016   CREATININE 1.09 (H) 04/24/2015   Estimated Creatinine Clearance: 23.9 mL/min (by C-G formula based on SCr of 0.97 mg/dL). Hemoglobin & Hematocrit     Component Value Date/Time   HGB 11.8 (L) 02/03/2017 0855   HGB 8.4 (L) 11/19/2013 0443   HCT 34.8 (L) 02/03/2017 0855   HCT 24.1 (L) 11/19/2013 0443     Per Protocol for Patient with estCrcl < 30 ml/min and BMI < 40, will transition to Lovenox 30 mg Q24h.

## 2017-02-03 NOTE — ED Triage Notes (Signed)
PAtient arrived by EMS from home for fall this AM. Patient lives with her daughter. Reports she was going into bathroom and felt lightheaded and fell. Patient has HX dementia so cannot remember much. Patient is Alert to self and place

## 2017-02-03 NOTE — ED Notes (Addendum)
MD at bedside. Attempted to ambulate patient for the second time. This RN has to pull patient to side of bed. Patient will stand but unable to take a step. O2 drops to 85 RA without oxygen when attempting to ambulate. Patient wears O2 at home as needed for SOB

## 2017-02-04 DIAGNOSIS — N390 Urinary tract infection, site not specified: Secondary | ICD-10-CM | POA: Diagnosis not present

## 2017-02-04 DIAGNOSIS — M810 Age-related osteoporosis without current pathological fracture: Secondary | ICD-10-CM | POA: Diagnosis not present

## 2017-02-04 DIAGNOSIS — S329XXA Fracture of unspecified parts of lumbosacral spine and pelvis, initial encounter for closed fracture: Secondary | ICD-10-CM | POA: Diagnosis not present

## 2017-02-04 DIAGNOSIS — D649 Anemia, unspecified: Secondary | ICD-10-CM | POA: Diagnosis not present

## 2017-02-04 MED ORDER — ENSURE ENLIVE PO LIQD
1.0000 | Freq: Three times a day (TID) | ORAL | Status: DC
  Administered 2017-02-04 – 2017-02-08 (×11): 237 mL via ORAL

## 2017-02-04 MED ORDER — CEPHALEXIN 500 MG PO CAPS
500.0000 mg | ORAL_CAPSULE | Freq: Two times a day (BID) | ORAL | Status: AC
  Administered 2017-02-04 – 2017-02-07 (×8): 500 mg via ORAL
  Filled 2017-02-04 (×8): qty 1

## 2017-02-04 NOTE — Progress Notes (Signed)
[facility] [place] NAME: Carol Vega    MR#:  [id number]  DATE OF BIRTH:  [birth year]  SUBJECTIVE:  Patient is pleasantly confused. Does not remember events from yesterday. Spoke with patient's daughter Izora Gala who lives with the patient. There is another sister who lives together. Patient had a mechanical fall yesterday sustained a right pubic pelvic fracture. Patient denies any pain at present  REVIEW OF SYSTEMS:   Review of Systems  Unable to perform ROS: Mental acuity   Tolerating Diet: Tolerating PT:   DRUG ALLERGIES:  No Known Allergies  VITALS:  Blood pressure 110/78, pulse (!) 101, temperature 98 F (36.7 C), temperature source Oral, resp. rate 18, height 5' (1.524 m), weight 41.7 kg (91 lb 14.4 oz), SpO2 96 %.  PHYSICAL EXAMINATION:   Physical Exam  GENERAL:  83 y.o.-year-old patient lying in the bed with no acute distress.  EYES: Pupils equal, round, reactive to light and accommodation. No scleral icterus. Extraocular muscles intact.  HEENT: Head atraumatic, normocephalic. Oropharynx and nasopharynx clear. Bruise above the right eye NECK:  Supple, no jugular venous distention. No thyroid enlargement, no tenderness.  LUNGS: Normal breath sounds bilaterally, no wheezing, rales, rhonchi. No use of accessory muscles of respiration.  CARDIOVASCULAR: S1, S2 normal. No murmurs, rubs, or gallops.  ABDOMEN: Soft, nontender, nondistended. Bowel sounds present. No organomegaly or mass.  EXTREMITIES: No cyanosis, clubbing or edema b/l.    NEUROLOGIC: Unable to participate completely secondary to pelvic fracture. Overall grossly nonfocal./l.   PSYCHIATRIC:  patient is alert and awake. Pleasant confusion.  SKIN: No obvious rash, lesion, or ulcer.   LABORATORY PANEL:  CBC  Recent Labs Lab 02/03/17 0855  WBC 9.3  HGB 11.8*  HCT 34.8*  PLT 260    Chemistries   Recent Labs Lab 02/03/17 0855  NA 138  K 4.4  CL 98*  CO2  33*  GLUCOSE 140*  BUN 40*  CREATININE 0.97  CALCIUM 9.3   Cardiac Enzymes  Recent Labs Lab 02/03/17 0855  TROPONINI <0.03   RADIOLOGY:  Dg Ribs Unilateral W/chest Right  Result Date: 02/03/2017 CLINICAL DATA:  Dizziness this morning followed by a fall. Multiple sites of pain. EXAM: RIGHT RIBS AND CHEST - 3+ VIEW COMPARISON:  04/24/2015. FINDINGS: Stable enlarged cardiac silhouette. Aortic calcification. Stable prominence of the interstitial markings and hyperexpansion of the lungs. Interval small amount of airspace opacity at the left lateral lung base and possible small bilateral pleural effusions. Diffuse osteopenia. No rib fracture or pneumothorax seen. IMPRESSION: 1. No fracture pneumothorax seen. 2. Stable cardiomegaly and changes of COPD. 3. Interval probable atelectasis at the left lateral lung base and possible small bilateral pleural effusions. 4. Aortic atherosclerosis. Electronically Signed   By: Claudie Revering M.D.   On: 02/03/2017 10:36   Dg Elbow Complete Right  Result Date: 02/03/2017 CLINICAL DATA:  Fall.  Elbow pain. EXAM: RIGHT ELBOW - COMPLETE 3+ VIEW COMPARISON:  None. FINDINGS: No acute bony abnormality. Specifically, no fracture, subluxation, or dislocation. Soft tissues are intact. No joint effusion. IMPRESSION: No acute bony abnormality. Electronically Signed   By: Rolm Baptise M.D.   On: 02/03/2017 10:28   Dg Wrist Complete Right  Result Date: 02/03/2017 CLINICAL DATA:  Fall, wrist pain EXAM: RIGHT WRIST - COMPLETE 3+ VIEW COMPARISON:  None. FINDINGS: No acute bony abnormality. Specifically, no fracture, subluxation, or dislocation. Soft tissues are intact. Degenerative changes at the first carpometacarpal joint. IMPRESSION: No acute bony  abnormality. Electronically Signed   By: Rolm Baptise M.D.   On: 02/03/2017 10:28   Ct Head Wo Contrast  Result Date: 02/03/2017 CLINICAL DATA:  Recent fall this morning EXAM: CT HEAD WITHOUT CONTRAST CT CERVICAL SPINE WITHOUT  CONTRAST TECHNIQUE: Multidetector CT imaging of the head and cervical spine was performed following the standard protocol without intravenous contrast. Multiplanar CT image reconstructions of the cervical spine were also generated. COMPARISON:  02/15/2016 FINDINGS: CT HEAD FINDINGS Brain: Diffuse atrophic changes and chronic white matter ischemic change is noted. No findings to suggest acute hemorrhage, acute infarction or space-occupying mass lesion are noted. Vascular: No hyperdense vessel or unexpected calcification. Skull: Postsurgical changes are noted in the right frontoparietal region. Sinuses/Orbits: No acute finding. Other: None. CT CERVICAL SPINE FINDINGS Alignment: Mild anterolisthesis of C4 on C5 is noted of a degenerative nature. Skull base and vertebrae: 7 cervical segments are well visualized. Vertebral body height is well maintained. Osteophytic changes are noted at all levels from C4-C7. Facet hypertrophic changes are noted at multiple levels. No acute fracture or acute facet abnormality is noted. Scoliosis of the cervicothoracic spine is noted. Soft tissues and spinal canal: No prevertebral fluid or swelling. No visible canal hematoma. Disc levels:  Multilevel disc space narrowing is noted from C3-T1. Upper chest: Biapical scarring is noted. IMPRESSION: CT of the head: Atrophic and ischemic changes as well as postoperative changes on the right. No acute abnormality noted. CT of the cervical spine: Multilevel degenerative change without acute abnormality. Electronically Signed   By: Inez Catalina M.D.   On: 02/03/2017 10:05   Ct Cervical Spine Wo Contrast  Result Date: 02/03/2017 CLINICAL DATA:  Recent fall this morning EXAM: CT HEAD WITHOUT CONTRAST CT CERVICAL SPINE WITHOUT CONTRAST TECHNIQUE: Multidetector CT imaging of the head and cervical spine was performed following the standard protocol without intravenous contrast. Multiplanar CT image reconstructions of the cervical spine were also  generated. COMPARISON:  02/15/2016 FINDINGS: CT HEAD FINDINGS Brain: Diffuse atrophic changes and chronic white matter ischemic change is noted. No findings to suggest acute hemorrhage, acute infarction or space-occupying mass lesion are noted. Vascular: No hyperdense vessel or unexpected calcification. Skull: Postsurgical changes are noted in the right frontoparietal region. Sinuses/Orbits: No acute finding. Other: None. CT CERVICAL SPINE FINDINGS Alignment: Mild anterolisthesis of C4 on C5 is noted of a degenerative nature. Skull base and vertebrae: 7 cervical segments are well visualized. Vertebral body height is well maintained. Osteophytic changes are noted at all levels from C4-C7. Facet hypertrophic changes are noted at multiple levels. No acute fracture or acute facet abnormality is noted. Scoliosis of the cervicothoracic spine is noted. Soft tissues and spinal canal: No prevertebral fluid or swelling. No visible canal hematoma. Disc levels:  Multilevel disc space narrowing is noted from C3-T1. Upper chest: Biapical scarring is noted. IMPRESSION: CT of the head: Atrophic and ischemic changes as well as postoperative changes on the right. No acute abnormality noted. CT of the cervical spine: Multilevel degenerative change without acute abnormality. Electronically Signed   By: Inez Catalina M.D.   On: 02/03/2017 10:05   Dg Hip Unilat W Or Wo Pelvis 2-3 Views Right  Result Date: 02/03/2017 CLINICAL DATA:  Multiple sites of pain following a fall this morning, including the right hip. EXAM: DG HIP (WITH OR WITHOUT PELVIS) 2-3V RIGHT COMPARISON:  None. FINDINGS: Diffuse osteopenia. Minimal femoral head and neck junction spur formation on the right without hip fracture or dislocation. Possible nondisplaced fracture of the right pubic  bone. Mild aortic calcification. IMPRESSION: Possible nondisplaced fracture of the right pubic bone, age indeterminate. No hip fracture or dislocation. Mild aortic atherosclerosis.  Electronically Signed   By: Claudie Revering M.D.   On: 02/03/2017 10:33   ASSESSMENT AND PLAN:  83 year old female with past medical history of osteoporosis, HTN, diverticulosis, atrial fibrillation, previous history of congestive heart failure, previous history of subdural hematoma who presented to the hospital a mechanical fall and noted to have a right pelvic fracture.  1. Status post fall and right-sided pelvic fracture-patient is in significant pain despite getting multiple doses of IV pain medications in the ER. - pain control and get a physical therapy evaluation. -Follow clinically, she  will need home health services upon discharge.  2. History of atrial fibrillation-currently rate controlled. Continue Cardizem.  3. Chronic anemia-continue iron supplements.  4. Osteoporosis-continue calcium supplements  Case discussed with Care Management/Social Worker. Management plans discussed with the patient, family and they are in agreement.  CODE STATUS:full  DVT Prophylaxis: lovenox  TOTAL TIME TAKING CARE OF THIS PATIENT: *12minutes.  >50% time spent on counselling and coordination of care  POSSIBLE D/C IN 1-2DAYS, DEPENDING ON CLINICAL CONDITION.  Note: This dictation was prepared with Dragon dictation along with smaller phrase technology. Any transcriptional errors that result from this process are unintentional.  Deon Duer M.D on 02/04/2017 at 1:38 PM  Between 7am to 6pm - Pager - [phone]  After 6pm go to [email or web address] - password [email or web address] [facility]  Office  [phone]  CC: Primary care physician; Einar Pheasant, MD

## 2017-02-04 NOTE — Care Management Obs Status (Signed)
MEDICARE OBSERVATION STATUS NOTIFICATION   Patient Details  Name: Adalis Gatti MRN: [id number] Date of Birth: [birth year]   Medicare Observation Status Notification Given:  Yes    Anne-Marie Genson A, RN 02/04/2017, 3:35 PM

## 2017-02-04 NOTE — Evaluation (Signed)
Physical Therapy Evaluation Patient Details Name: Carol Vega MRN: [id number] DOB: 1921-04-02 Today's Date: 02/04/2017   History of Present Illness  83 yo female with onset of pelvic fracture after recent fall, pain and decreased gait.  Per daughter was independent to walk at home all over the house.  PMHx:  cardiomegaly, pleural effusion, COPD, dizziness, DJD c-spine  Clinical Impression  Pt is seen for mobility assessment and did not demonstrate any initiation of transfers, bed mob or ability to control sitting posture.  Her kyphotic spine is a hindrance for mobility but per nsg daughter said pt walked in a flexed posture prior to her admission.  Pt is appropriate to see acutely for pregait and balance skills, but due to pain cannot stand or walk without total assist.  Her plan is to continue with transfers and LE ROM as tolerated, and recommend inpt care of SNF to restore her mobility to PLOF.    Follow Up Recommendations SNF    Equipment Recommendations  None recommended by PT (determine at next venue)    Recommendations for Other Services       Precautions / Restrictions Precautions Precautions: Fall (monitor O2 sats) Precaution Comments: Pt was in bed with no O2 on and was 80% sat when PT arrived Restrictions Weight Bearing Restrictions: No      Mobility  Bed Mobility Overal bed mobility: Needs Assistance Bed Mobility: Supine to Sit     Supine to sit: Max assist;+2 for physical assistance;+2 for safety/equipment;HOB elevated     General bed mobility comments: nursing in to assist as pt in pain to move  Transfers Overall transfer level: Needs assistance Equipment used: 1 person hand held assist (nursing nearby to assist if needed) Transfers: Sit to/from [facility] Sit to Stand: Max assist Stand pivot transfers: Total assist       General transfer comment: Pt cannot stand on RLE and cannot take a step  Ambulation/Gait             General  Gait Details: unable  Stairs            Wheelchair Mobility    Modified Rankin (Stroke Patients Only)       Balance Overall balance assessment: History of Falls;Needs assistance Sitting-balance support:  (Pt is fully supported at trunk) Sitting balance-Leahy Scale: Poor       Standing balance-Leahy Scale: Zero                               Pertinent Vitals/Pain Pain Assessment: Faces Faces Pain Scale: Hurts whole lot Pain Location: R hip with ROM or transfers Pain Intervention(s): Limited activity within patient's tolerance;Monitored during session;Premedicated before session;Repositioned    Home Living Family/patient expects to be discharged to:: Private residence Living Arrangements: Children Available Help at Discharge: Family;Available 24 hours/day Type of Home: House         Home Equipment:  (pt not able to report to PT)      Prior Function Level of Independence:  (no information by pt)               Hand Dominance        Extremity/Trunk Assessment   Upper Extremity Assessment Upper Extremity Assessment: Generalized weakness    Lower Extremity Assessment Lower Extremity Assessment: Generalized weakness    Cervical / Trunk Assessment Cervical / Trunk Assessment: Kyphotic  Communication   Communication: Other (comment) (Pt is unable to provide details  but speaks well )  Cognition Arousal/Alertness: Lethargic Behavior During Therapy: Flat affect Overall Cognitive Status: No family/caregiver present to determine baseline cognitive functioning                      General Comments      Exercises     Assessment/Plan    PT Assessment Patient needs continued PT services  PT Problem List Decreased range of motion;Decreased strength;Decreased activity tolerance;Decreased balance;Decreased mobility;Decreased coordination;Decreased cognition;Decreased safety awareness;Cardiopulmonary status limiting activity;Pain        PT Treatment Interventions Functional mobility training;Therapeutic activities;Therapeutic exercise;Balance training;Neuromuscular re-education;Patient/family education    PT Goals (Current goals can be found in the Care Plan section)  Acute Rehab PT Goals Patient Stated Goal: none stated PT Goal Formulation: Patient unable to participate in goal setting Time For Goal Achievement: 02/18/17 Potential to Achieve Goals: Fair    Frequency Min 2X/week   Barriers to discharge  (not a clear history of her PLOF) pt is total assist to pivot to chair and no longer ambulatory    Co-evaluation               End of Session Equipment Utilized During Treatment: Gait belt;Oxygen Activity Tolerance: Patient limited by pain;Patient limited by lethargy Patient left: in chair;with call bell/phone within reach;with chair alarm set;with nursing/sitter in room Nurse Communication: Mobility status PT Visit Diagnosis: Muscle weakness (generalized) (M62.81);Repeated falls (R29.6);Other abnormalities of gait and mobility (R26.89)    Functional Assessment Tool Used: AM-PAC 6 Clicks Basic Mobility;Clinical judgement Functional Limitation: Mobility: Walking and moving around Mobility: Walking and Moving Around Current Status (J2820): At least 60 percent but less than 80 percent impaired, limited or restricted Mobility: Walking and Moving Around Goal Status [phone]): At least 20 percent but less than 40 percent impaired, limited or restricted    Time: 0923-0943 PT Time Calculation (min) (ACUTE ONLY): 20 min   Charges:   PT Evaluation $PT Eval Moderate Complexity: 1 Procedure     PT G Codes:   PT G-Codes **NOT FOR INPATIENT CLASS** Functional Assessment Tool Used: AM-PAC 6 Clicks Basic Mobility;Clinical judgement Functional Limitation: Mobility: Walking and moving around Mobility: Walking and Moving Around Current Status (F5379): At least 60 percent but less than 80 percent impaired, limited or  restricted Mobility: Walking and Moving Around Goal Status [phone]): At least 20 percent but less than 40 percent impaired, limited or restricted     Ramond Dial 02/04/2017, 1:53 PM   Mee Hives, PT MS Acute Rehab Dept. Number: [place] [facility] [place]

## 2017-02-04 NOTE — Progress Notes (Signed)
Nutrition Brief Note  Received page from nutritional services about issues with diet.  Spoke with pt's daughter via phone who reports that pt only takes foods through a straw. She does not eat any solid food even puree foods. Daughter blends all food until it is drinkable. She also drinks 3 boost or ensure supplements per day.  Assisted daughter in ordering dinner, breakfast, and lunch will only liquids. Mighty Shake II added to all meal trays. Ordered ensure enlive TID with meals.  Pt would likely benefit from SLP eval while hospitalized to determine most appropriate diet.  RD will follow up as appropriate.  Spoke with RN caring for patient with plans.   Body mass index is 17.95 kg/m. Patient meets criteria for underweight based on current BMI.   Current diet order is Dysphagia 1/Thin, patient is consuming approximately 0% of meals at this time. Labs and medications reviewed.   No nutrition interventions warranted at this time. If nutrition issues arise, please consult RD.   [place], [place], [place] Pager [phone] After Hours Pager

## 2017-02-05 DIAGNOSIS — Z800 Family history of malignant neoplasm of digestive organs: Secondary | ICD-10-CM | POA: Diagnosis not present

## 2017-02-05 DIAGNOSIS — S32501D Unspecified fracture of right pubis, subsequent encounter for fracture with routine healing: Secondary | ICD-10-CM | POA: Diagnosis not present

## 2017-02-05 DIAGNOSIS — K219 Gastro-esophageal reflux disease without esophagitis: Secondary | ICD-10-CM | POA: Diagnosis not present

## 2017-02-05 DIAGNOSIS — R000 Tachycardia, unspecified: Secondary | ICD-10-CM | POA: Diagnosis present

## 2017-02-05 DIAGNOSIS — Z8261 Family history of arthritis: Secondary | ICD-10-CM | POA: Diagnosis not present

## 2017-02-05 DIAGNOSIS — R64 Cachexia: Secondary | ICD-10-CM | POA: Diagnosis present

## 2017-02-05 DIAGNOSIS — S329XXA Fracture of unspecified parts of lumbosacral spine and pelvis, initial encounter for closed fracture: Secondary | ICD-10-CM | POA: Diagnosis not present

## 2017-02-05 DIAGNOSIS — Z23 Encounter for immunization: Secondary | ICD-10-CM | POA: Diagnosis not present

## 2017-02-05 DIAGNOSIS — Y92002 Bathroom of unspecified non-institutional (private) residence single-family (private) house as the place of occurrence of the external cause: Secondary | ICD-10-CM | POA: Diagnosis not present

## 2017-02-05 DIAGNOSIS — I509 Heart failure, unspecified: Secondary | ICD-10-CM | POA: Diagnosis not present

## 2017-02-05 DIAGNOSIS — Z79899 Other long term (current) drug therapy: Secondary | ICD-10-CM | POA: Diagnosis not present

## 2017-02-05 DIAGNOSIS — I110 Hypertensive heart disease with heart failure: Secondary | ICD-10-CM | POA: Diagnosis present

## 2017-02-05 DIAGNOSIS — S32501A Unspecified fracture of right pubis, initial encounter for closed fracture: Secondary | ICD-10-CM | POA: Diagnosis present

## 2017-02-05 DIAGNOSIS — W1830XA Fall on same level, unspecified, initial encounter: Secondary | ICD-10-CM | POA: Diagnosis not present

## 2017-02-05 DIAGNOSIS — S51011A Laceration without foreign body of right elbow, initial encounter: Secondary | ICD-10-CM | POA: Diagnosis present

## 2017-02-05 DIAGNOSIS — N3000 Acute cystitis without hematuria: Secondary | ICD-10-CM | POA: Diagnosis present

## 2017-02-05 DIAGNOSIS — R0902 Hypoxemia: Secondary | ICD-10-CM | POA: Diagnosis present

## 2017-02-05 DIAGNOSIS — D649 Anemia, unspecified: Secondary | ICD-10-CM | POA: Diagnosis present

## 2017-02-05 DIAGNOSIS — M25531 Pain in right wrist: Secondary | ICD-10-CM | POA: Diagnosis present

## 2017-02-05 DIAGNOSIS — F0390 Unspecified dementia without behavioral disturbance: Secondary | ICD-10-CM | POA: Diagnosis present

## 2017-02-05 DIAGNOSIS — I5031 Acute diastolic (congestive) heart failure: Secondary | ICD-10-CM | POA: Diagnosis present

## 2017-02-05 DIAGNOSIS — W19XXXD Unspecified fall, subsequent encounter: Secondary | ICD-10-CM | POA: Diagnosis not present

## 2017-02-05 DIAGNOSIS — R102 Pelvic and perineal pain: Secondary | ICD-10-CM | POA: Diagnosis not present

## 2017-02-05 DIAGNOSIS — S0990XA Unspecified injury of head, initial encounter: Secondary | ICD-10-CM | POA: Diagnosis present

## 2017-02-05 DIAGNOSIS — I4891 Unspecified atrial fibrillation: Secondary | ICD-10-CM | POA: Diagnosis present

## 2017-02-05 DIAGNOSIS — N390 Urinary tract infection, site not specified: Secondary | ICD-10-CM | POA: Diagnosis not present

## 2017-02-05 DIAGNOSIS — Z7401 Bed confinement status: Secondary | ICD-10-CM | POA: Diagnosis not present

## 2017-02-05 DIAGNOSIS — Z681 Body mass index (BMI) 19 or less, adult: Secondary | ICD-10-CM | POA: Diagnosis not present

## 2017-02-05 DIAGNOSIS — M810 Age-related osteoporosis without current pathological fracture: Secondary | ICD-10-CM | POA: Diagnosis present

## 2017-02-05 DIAGNOSIS — R0789 Other chest pain: Secondary | ICD-10-CM | POA: Diagnosis present

## 2017-02-05 LAB — BRAIN NATRIURETIC PEPTIDE: B Natriuretic Peptide: 646 pg/mL — ABNORMAL HIGH (ref 0.0–100.0)

## 2017-02-05 MED ORDER — ENSURE ENLIVE PO LIQD: 1.0000 | Freq: Three times a day (TID) | ORAL | 12 refills | Status: AC

## 2017-02-05 MED ORDER — FUROSEMIDE 20 MG PO TABS
20.0000 mg | ORAL_TABLET | Freq: Every day | ORAL | Status: DC
  Administered 2017-02-06 – 2017-02-07 (×2): 20 mg via ORAL
  Filled 2017-02-05 (×2): qty 1

## 2017-02-05 MED ORDER — FUROSEMIDE 10 MG/ML IJ SOLN
20.0000 mg | Freq: Once | INTRAMUSCULAR | Status: AC
  Administered 2017-02-05: 20 mg via INTRAVENOUS
  Filled 2017-02-05: qty 2

## 2017-02-05 MED ORDER — HYDROCODONE-ACETAMINOPHEN 5-325 MG PO TABS: 1.0000 | ORAL_TABLET | ORAL | 0 refills | Status: AC | PRN

## 2017-02-05 MED ORDER — CEPHALEXIN 500 MG PO CAPS: 500.0000 mg | ORAL_CAPSULE | Freq: Two times a day (BID) | ORAL | 0 refills | Status: DC

## 2017-02-05 NOTE — Progress Notes (Signed)
Called by RN patient not able to be weaned off oxygen. Sats dropped down in the 80s. She has history of CHF. Although patient does not exhibit times symptoms I will do a BNP. If positive then we will give her IV Lasix to see if her oxygenation Improved given chest x-ray findings of increased interstitial markings/COPD changes noted on chest x-ray as well. Patient may need oxygen to go home with. If she does not do well while and requires IV Lasix and then patient will have to stay another day to monitor her oxygen.

## 2017-02-05 NOTE — Clinical Social Work Note (Signed)
CSW received referral for SNF.  Case discussed with case manager and plan is to discharge home with home health.  CSW to sign off please re-consult if social work needs arise.  Akita Maxim R. Freda Jaquith, MSW, LCSWA [phone]  

## 2017-02-05 NOTE — Progress Notes (Signed)
Pt O2 sat 86% on room air. Nasal cannula 1 L O2 placed on pt, pt now  94%. MD Posey Pronto notified. MD placing orders.

## 2017-02-05 NOTE — Progress Notes (Signed)
MD Posey Pronto notified that pts BNP results are in. Orders received to cancel discharge for today, orders received for  IV Lasix  20 mg X1, PO Lasix 20 mg daily starting tomm am, orders placed. Daughter Izora Gala notified of the above.

## 2017-02-05 NOTE — Progress Notes (Signed)
Pt was found to be hypoxic in the 80-85% on Ra improved to >92% with 2 liter [place] Found to have BNP of 600 Mild CHF with stress from pelvic fracture Echo in am Change to oral lasix 20 mg QOD and wean oxgen to off Pt will benefit for rehab CSW for d/c planning

## 2017-02-05 NOTE — Care Management Note (Signed)
Case Management Note  Patient Details  Name: Tressa Maldonado MRN: [id number] Date of Birth: August 27, 1921  Subjective/Objective:    Notified Cheryl at [facility] that Mrs Ranes' discharge today was cancelled per blood pressure issues. Anticipate discharge tomorrow.                 Action/Plan:   Expected Discharge Date:  02/05/17               Expected Discharge Plan:     In-House Referral:     Discharge planning Services     Post Acute Care Choice:    Choice offered to:     DME Arranged:    DME Agency:     HH Arranged:    HH Agency:     Status of Service:     If discussed at [employer], dates discussed:    Additional Comments:  Seymour Pavlak A, RN 02/05/2017, 5:07 PM

## 2017-02-05 NOTE — Progress Notes (Signed)
Scheduled morning dose of Cardizem held.(can't crush) MD Patel notified. Verbal order received to give Keflex evening dose between 4:30-5:00. Pt can d/c home after abx dose.

## 2017-02-05 NOTE — Care Management Note (Addendum)
Case Management Note  Patient Details  Name: Carol Vega MRN: [id number] Date of Birth: [birth year]  Subjective/Objective:     A referral for HH-PT was called to Malachy Mood at [place] per sister's choice of providers, and Malachy Mood accepted this referral. Discussed with family that Mrs Mansouri is currently an Observation status patient per her pelvic hip fracture per Medicare guidelines, and is therefore not eligible for Medicare authorization for rehab.                Action/Plan:   Expected Discharge Date:  02/05/17               Expected Discharge Plan:     In-House Referral:     Discharge planning Services     Post Acute Care Choice:    Choice offered to:     DME Arranged:    DME Agency:     HH Arranged:    HH Agency:     Status of Service:     If discussed at [employer], dates discussed:    Additional Comments:  Dinh Ayotte A, RN 02/05/2017, 11:47 AM

## 2017-02-05 NOTE — Discharge Summary (Signed)
[place] [facility] [place] NAME: Carol Vega    MR#:  [id number]  DATE OF BIRTH:  02-22-1921  DATE OF ADMISSION:  02/03/2017 ADMITTING PHYSICIAN: Henreitta Leber, MD  DATE OF DISCHARGE: 02/05/17  PRIMARY CARE PHYSICIAN: Einar Pheasant, MD    ADMISSION DIAGNOSIS:  Acute cystitis without hematuria [N30.00] Fall, initial encounter [W19.XXXA] Closed nondisplaced fracture of pelvis, unspecified part of pelvis, initial encounter ([place]) [S32.9XXA]  DISCHARGE DIAGNOSIS:  Right pelvic fracture Acute Cystitis  SECONDARY DIAGNOSIS:   Past Medical History:  Diagnosis Date  . Aneurysm ([place])    inter atrial septum  . Atrial fibrillation ([place])   . CHF (congestive heart failure) (HCC)    h/o pleural effusions  . Diverticulosis   . Hypertension   . Osteoporosis   . Subdural hematoma [facility])    admitted 2007    HOSPITAL COURSE:   83 year old female with past medical history of osteoporosis, HTN, diverticulosis, atrial fibrillation history of subdural hematoma who presented to the hospital a mechanical fall and noted to have a right pelvic fracture.  1. Status post fall and right-sided pelvic fracture-patient is in significant pain despite getting multiple doses of IV pain medications in the ER. - pain control  - physical therapy evaluation.noted recommends rehb. Pt underobservation and family not able to pay privately. HHPT to be arranged - spoke with dter Teodora Medici  2. History of atrial fibrillation-currently rate controlled. Continue Cardizem.  3. Chronic anemia-continue iron supplements.  4. Osteoporosis-continue calcium supplements  5. Acute cystitis Keflex x5 days  D/c home with HHPT CM aware  CONSULTS OBTAINED:  Treatment Team:  Fritzi Mandes, MD  DRUG ALLERGIES:  No Known Allergies  DISCHARGE MEDICATIONS:   Current Discharge Medication List    START taking these medications   Details  cephALEXin (KEFLEX) 500 MG  capsule Take 1 capsule (500 mg total) by mouth every 12 (twelve) hours. Qty: 10 capsule, Refills: 0    feeding supplement, ENSURE ENLIVE, (ENSURE ENLIVE) LIQD Take 237 mLs by mouth 3 (three) times daily with meals. Qty: 237 mL, Refills: 12    HYDROcodone-acetaminophen (NORCO/VICODIN) 5-325 MG tablet Take 1-2 tablets by mouth every 4 (four) hours as needed for moderate pain. Qty: 25 tablet, Refills: 0      CONTINUE these medications which have NOT CHANGED   Details  Ascorbic Acid (VITAMIN C PO) Take by mouth.    CALCIUM-MAGNESIUM PO Take by mouth daily.    Cholecalciferol (VITAMIN D PO) Take 1 tablet by mouth daily.    Cyanocobalamin (VITAMIN B-12 PO) Take 1 Dose by mouth daily.    diltiazem (CARDIZEM CD) 120 MG 24 hr capsule TAKE ONE CAPSULE BY MOUTH DAILY Qty: 30 capsule, Refills: 5    ferrous sulfate 325 (65 FE) MG tablet TAKE ONE (1) TABLET BY MOUTH 3 TIMES DAILY WITH FOOD Qty: 90 tablet, Refills: 4    Multiple Vitamins-Minerals (PRESERVISION AREDS 2) CAPS Take by mouth 2 (two) times daily.    Omega-3 Fatty Acids (FISH OIL) 1000 MG CAPS Take 1 capsule by mouth daily.      STOP taking these medications     losartan (COZAAR) 50 MG tablet      NEXIUM 40 MG capsule         If you experience worsening of your admission symptoms, develop shortness of breath, life threatening emergency, suicidal or homicidal thoughts you must seek medical attention immediately by calling 911 or calling your MD immediately  if  symptoms less severe.  You Must read complete instructions/literature along with all the possible adverse reactions/side effects for all the Medicines you take and that have been prescribed to you. Take any new Medicines after you have completely understood and accept all the possible adverse reactions/side effects.   Please note  You were cared for by a hospitalist during your hospital stay. If you have any questions about your discharge medications or the care you  received while you were in the hospital after you are discharged, you can call the unit and asked to speak with the hospitalist on call if the hospitalist that took care of you is not available. Once you are discharged, your primary care physician will handle any further medical issues. Please note that NO REFILLS for any discharge medications will be authorized once you are discharged, as it is imperative that you return to your primary care physician (or establish a relationship with a primary care physician if you do not have one) for your aftercare needs so that they can reassess your need for medications and monitor your lab values. Today   SUBJECTIVE   No new complaints  VITAL SIGNS:  Blood pressure 135/89, pulse (!) 110, temperature 98.3 F (36.8 C), temperature source Oral, resp. rate 16, height 5' (1.524 m), weight 41.7 kg (91 lb 14.4 oz), SpO2 97 %.  I/O:   Intake/Output Summary (Last 24 hours) at 02/05/17 1018 Last data filed at 02/04/17 1907  [place] per 24 hour  Intake              200 ml  Output                0 ml  Net              200 ml    PHYSICAL EXAMINATION:  GENERAL:  83 y.o.-year-old patient lying in the bed with no acute distress. thin  EYES: Pupils equal, round, reactive to light and accommodation. No scleral icterus. Extraocular muscles intact.  HEENT: Head atraumatic, normocephalic. Oropharynx and nasopharynx clear.  NECK:  Supple, no jugular venous distention. No thyroid enlargement, no tenderness.  LUNGS: Normal breath sounds bilaterally, no wheezing, rales,rhonchi or crepitation. No use of accessory muscles of respiration.  CARDIOVASCULAR: S1, S2 normal. No murmurs, rubs, or gallops.  ABDOMEN: Soft, non-tender, non-distended. Bowel sounds present. No organomegaly or mass.  EXTREMITIES: No pedal edema, cyanosis, or clubbing.  NEUROLOGIC: Cranial nerves II through XII are intact. Muscle strength 5/5 in all extremities. Sensation intact. Gait not checked.   PSYCHIATRIC: The patient is alert and oriented x 3.  SKIN: No obvious rash, lesion, or ulcer.   DATA REVIEW:   CBC   Recent Labs Lab 02/03/17 0855  WBC 9.3  HGB 11.8*  HCT 34.8*  PLT 260    Chemistries   Recent Labs Lab 02/03/17 0855  NA 138  K 4.4  CL 98*  CO2 33*  GLUCOSE 140*  BUN 40*  CREATININE 0.97  CALCIUM 9.3    Microbiology Results   No results found for this or any previous visit (from the past 240 hour(s)).  RADIOLOGY:  Dg Ribs Unilateral W/chest Right  Result Date: 02/03/2017 CLINICAL DATA:  Dizziness this morning followed by a fall. Multiple sites of pain. EXAM: RIGHT RIBS AND CHEST - 3+ VIEW COMPARISON:  04/24/2015. FINDINGS: Stable enlarged cardiac silhouette. Aortic calcification. Stable prominence of the interstitial markings and hyperexpansion of the lungs. Interval small amount of airspace opacity at the left lateral  lung base and possible small bilateral pleural effusions. Diffuse osteopenia. No rib fracture or pneumothorax seen. IMPRESSION: 1. No fracture pneumothorax seen. 2. Stable cardiomegaly and changes of COPD. 3. Interval probable atelectasis at the left lateral lung base and possible small bilateral pleural effusions. 4. Aortic atherosclerosis. Electronically Signed   By: Claudie Revering M.D.   On: 02/03/2017 10:36   Dg Elbow Complete Right  Result Date: 02/03/2017 CLINICAL DATA:  Fall.  Elbow pain. EXAM: RIGHT ELBOW - COMPLETE 3+ VIEW COMPARISON:  None. FINDINGS: No acute bony abnormality. Specifically, no fracture, subluxation, or dislocation. Soft tissues are intact. No joint effusion. IMPRESSION: No acute bony abnormality. Electronically Signed   By: Rolm Baptise M.D.   On: 02/03/2017 10:28   Dg Wrist Complete Right  Result Date: 02/03/2017 CLINICAL DATA:  Fall, wrist pain EXAM: RIGHT WRIST - COMPLETE 3+ VIEW COMPARISON:  None. FINDINGS: No acute bony abnormality. Specifically, no fracture, subluxation, or dislocation. Soft tissues are  intact. Degenerative changes at the first carpometacarpal joint. IMPRESSION: No acute bony abnormality. Electronically Signed   By: Rolm Baptise M.D.   On: 02/03/2017 10:28   Dg Hip Unilat W Or Wo Pelvis 2-3 Views Right  Result Date: 02/03/2017 CLINICAL DATA:  Multiple sites of pain following a fall this morning, including the right hip. EXAM: DG HIP (WITH OR WITHOUT PELVIS) 2-3V RIGHT COMPARISON:  None. FINDINGS: Diffuse osteopenia. Minimal femoral head and neck junction spur formation on the right without hip fracture or dislocation. Possible nondisplaced fracture of the right pubic bone. Mild aortic calcification. IMPRESSION: Possible nondisplaced fracture of the right pubic bone, age indeterminate. No hip fracture or dislocation. Mild aortic atherosclerosis. Electronically Signed   By: Claudie Revering M.D.   On: 02/03/2017 10:33     Management plans discussed with the patient, family and they are in agreement.  CODE STATUS:     Code Status Orders        Start     Ordered   02/03/17 1848  Full code  Continuous     02/03/17 1847    Code Status History    Date Active Date Inactive Code Status Order ID Comments User Context   This patient has a current code status but no historical code status.    Advance Directive Documentation     Most Recent Value  Type of Advance Directive  Healthcare Power of Attorney  Pre-existing out of facility DNR order (yellow form or pink MOST form)  -  "MOST" Form in Place?  -      TOTAL TIME TAKING CARE OF THIS PATIENT: 40  minutes.    Nairi Oswald M.D on 02/05/2017 at 10:18 AM  Between 7am to 6pm - Pager - [phone] After 6pm go to [email or web address] - password [email or web address] [facility]  Office  [phone]  CC: Primary care physician; Einar Pheasant, MD

## 2017-02-06 ENCOUNTER — Inpatient Hospital Stay (HOSPITAL_COMMUNITY)
Admit: 2017-02-06 | Discharge: 2017-02-06 | Disposition: A | Discharge status: Home / Self Care | Payer: Medicare Other | Attending: Internal Medicine | Admitting: Internal Medicine

## 2017-02-06 ENCOUNTER — Inpatient Hospital Stay: Admit: 2017-02-06 | Payer: Medicare Other

## 2017-02-06 DIAGNOSIS — I509 Heart failure, unspecified: Secondary | ICD-10-CM

## 2017-02-06 DIAGNOSIS — L8990 Pressure ulcer of unspecified site, unspecified stage: Secondary | ICD-10-CM | POA: Insufficient documentation

## 2017-02-06 MED ORDER — DILTIAZEM HCL ER COATED BEADS 180 MG PO CP24: 180.0000 mg | ORAL_CAPSULE | Freq: Every day | ORAL | Status: DC

## 2017-02-06 MED ORDER — DILTIAZEM HCL 30 MG PO TABS
60.0000 mg | ORAL_TABLET | Freq: Three times a day (TID) | ORAL | Status: DC
  Administered 2017-02-06 – 2017-02-07 (×3): 60 mg via ORAL
  Filled 2017-02-06 (×3): qty 2

## 2017-02-06 MED ORDER — DILTIAZEM HCL 30 MG PO TABS
60.0000 mg | ORAL_TABLET | Freq: Once | ORAL | Status: DC
Start: 2017-02-06 — End: 2017-02-06

## 2017-02-06 NOTE — Care Management (Signed)
Met with daughter, Carol Vega at bedside. Discussed home health vs. SNF. She is concerned about taking her home because she does not "know how to take are of her" Carol Vega states she herself has brain tumors and chronic headaches so it is difficult for her to concentrate and comprehend at times. I reassured her that if she took patient home this RNCM would set up all the services possible and any needed equipment. She stated she appreciated the help. Plan is for reassessment tomorrow and see how patient is progressing. daughter agrees with POC.

## 2017-02-06 NOTE — NC FL2 (Signed)
[place] LEVEL OF CARE SCREENING TOOL     IDENTIFICATION  Patient Name: Carol Vega Birthdate: [birth year] Sex: female Admission Date (Current Location): 02/03/2017  [place] and [place] Number:  Engineering geologist and Address:  [facility], [address] [place], [place] [address]      Provider Number: [id number]  Attending Physician Name and Address:  Vaughan Basta, MD  Relative Name and Phone Number:  [name], [name] Daughter [phone]     Current Level of Care: Hospital Recommended Level of Care: [place] Prior Approval Number:    Date Approved/Denied:   PASRR Number: [id number]  Discharge Plan: SNF    Current Diagnoses: Patient Active Problem List   Diagnosis Date Noted  . Pressure injury of skin 02/06/2017  . Heart failure ([place]) 02/05/2017  . Pelvic fracture ([place]) 02/03/2017  . Difficulty swallowing 09/04/2016  . SOB (shortness of breath) 04/24/2015  . Health care maintenance 01/26/2015  . GI bleed 12/08/2013  . CHF (congestive heart failure) ([place]) 12/08/2013  . Hyponatremia 02/10/2013  . Atrial fibrillation ([place] 09/30/2012  . Hypertension 09/30/2012  . 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 Osteoporosis 09/30/2012  . Anemia 09/30/2012    Orientation RESPIRATION BLADDER Height & Weight     Self  O2 (2L) Incontinent Weight: 91 lb 14.4 oz (41.7 kg) Height:  5' (152.4 cm)  BEHAVIORAL SYMPTOMS/MOOD NEUROLOGICAL BOWEL NUTRITION STATUS      Continent Diet (Dysphagia 1)  AMBULATORY STATUS COMMUNICATION OF NEEDS Skin   Limited Assist Verbally PU Stage and Appropriate Care PU Stage 1 Dressing:  (PRN)                     Personal Care Assistance Level of Assistance  Bathing, Feeding, Dressing Bathing Assistance: Limited assistance Feeding assistance: Limited assistance Dressing Assistance: Limited assistance     Functional Limitations Info  Sight,  Hearing, Speech Sight Info: Adequate Hearing Info: Adequate Speech Info: Adequate    SPECIAL CARE FACTORS FREQUENCY  PT (By licensed PT)     PT Frequency: 5x a week              Contractures Contractures Info: Not present    Additional Factors Info  Code Status, Allergies Code Status Info: Full Code Allergies Info: NKA           Current Medications (02/06/2017):  This is the current hospital active medication list Current Facility-Administered Medications  Medication Dose Route Frequency Provider Last Rate Last Dose  . acetaminophen (TYLENOL) tablet 650 mg  650 mg Oral Q6H PRN Henreitta Leber, MD   650 mg at 02/05/17 1779   Or  . acetaminophen (TYLENOL) suppository 650 mg  650 mg Rectal Q6H PRN Henreitta Leber, MD      . calcium carbonate (TUMS - dosed in mg elemental calcium) chewable tablet 200 mg of elemental calcium  1 tablet Oral Daily Henreitta Leber, MD   200 mg of elemental calcium at 02/06/17 0959  . cephALEXin (KEFLEX) capsule 500 mg  500 mg Oral Q12H Vaughan Basta, MD   500 mg at 02/06/17 0959  . cholecalciferol (VITAMIN D) tablet 400 Units  400 Units Oral Daily Henreitta Leber, MD   400 Units at 02/06/17 [phone]  . diltiazem (CARDIZEM) tablet 60 mg  60 mg Oral Q8H Vaughan Basta, MD   60 mg at 02/06/17 1248  . enoxaparin (LOVENOX) injection 30 mg  30 mg Subcutaneous Q24H  Henreitta Leber, MD   30 mg at 02/05/17 2106  . feeding supplement (ENSURE ENLIVE) (ENSURE ENLIVE) liquid 237 mL  1 Bottle Oral TID WC Fritzi Mandes, MD   237 mL at 02/06/17 1257  . ferrous sulfate tablet 325 mg  325 mg Oral TID WC Henreitta Leber, MD   325 mg at 02/06/17 1248  . furosemide (LASIX) tablet 20 mg  20 mg Oral Daily Fritzi Mandes, MD   20 mg at 02/06/17 0959  . HYDROcodone-acetaminophen (NORCO/VICODIN) 5-325 MG per tablet 1-2 tablet  1-2 tablet Oral Q4H PRN Henreitta Leber, MD   1 tablet at 02/04/17 1124  . omega-3 acid ethyl esters (LOVAZA) capsule 1 g  1 g Oral Daily Henreitta Leber, MD   1 g at 02/04/17 1124  . ondansetron (ZOFRAN) tablet 4 mg  4 mg Oral Q6H PRN Henreitta Leber, MD       Or  . ondansetron (ZOFRAN) injection 4 mg  4 mg Intravenous Q6H PRN Henreitta Leber, MD         Discharge Medications: Please see discharge summary for a list of discharge medications.  Relevant Imaging Results:  Relevant Lab Results:   Additional Information SSN [id number]  Ross Ludwig, [place]

## 2017-02-06 NOTE — Clinical Social Work Placement (Signed)
   CLINICAL SOCIAL WORK PLACEMENT  NOTE  Date:  02/06/2017  Patient Details  Name: Carol Vega MRN: [id number] Date of Birth: 01/20/1921  Clinical Social Work is seeking post-discharge placement for this patient at the [place] level of care (*CSW will initial, date and re-position this form in  chart as items are completed):  Yes   Patient/family provided with [place] Work Department's list of facilities offering this level of care within the geographic area requested by the patient (or if unable, by the patient's family).  Yes   Patient/family informed of their freedom to choose among providers that offer the needed level of care, that participate in Medicare, Medicaid or managed care program needed by the patient, have an available bed and are willing to accept the patient.  Yes   Patient/family informed of [facility]'s ownership interest in [facility] and [facility], as well as of the fact that they are under no obligation to receive care at these facilities.  PASRR submitted to EDS on 02/06/17     PASRR number received on 02/06/17     Existing PASRR number confirmed on       FL2 transmitted to all facilities in geographic area requested by pt/family on 02/06/17     FL2 transmitted to all facilities within larger geographic area on       Patient informed that his/her managed care company has contracts with or will negotiate with certain facilities, including the following:            Patient/family informed of bed offers received.  Patient chooses bed at       Physician recommends and patient chooses bed at      Patient to be transferred to   on  .  Patient to be transferred to facility by       Patient family notified on   of transfer.  Name of family member notified:        PHYSICIAN Please sign FL2     Additional Comment:    _______________________________________________ Ross Ludwig, LCSWA 02/06/2017, 4:05  PM

## 2017-02-06 NOTE — Clinical Social Work Note (Addendum)
CSW spoke to patient's daughter Izora Gala who was interested in SNF placement.  Patient's daughter will be at hospital around 11am and would like to speak with CSW.  11:30am  CSW met with patient's daughter and talked to her about the process of looking for bed placement for patient.  CSW explained to patient's daughter that because patient has not had a 3 day qualifying stay in the hospital, patient would have to pay privately for SNF.  Patient's daughter expressed understanding and still asked CSW to begin bed search.  CSW to follow up with patient's daughter in regards to SNF bed offers.  Jones Broom. [place], MSW, [place]  02/06/2017 10:17 AM

## 2017-02-06 NOTE — Clinical Social Work Note (Signed)
Clinical Social Work Assessment  Patient Details  Name: Carol Vega MRN: [id number] Date of Birth: 1921/02/21  Date of referral:  02/06/17               Reason for consult:  Facility Placement                Permission sought to share information with:  Facility Sport and exercise psychologist, Family Supports Permission granted to share information::  Yes, Verbal Permission Granted  Name::     Jhoselin, [place] Daughter [phone]   Agency::  SNF admissions  Relationship::     Contact Information:     Housing/Transportation Living arrangements for the past 2 months:  [place] of Information:  Adult Children, Patient Patient Interpreter Needed:  None Criminal Activity/Legal Involvement Pertinent to Current Situation/Hospitalization:  No - Comment as needed Significant Relationships:  Adult Children Lives with:  Adult Children Do you feel safe going back to the place where you live?  No Need for family participation in patient care:  Yes (Comment)  Care giving concerns:  Patient's family feel she needs some short term rehab before she is able to return back home.   Social Worker assessment / plan:  Patient is a 83 year old female who lives with her daughters.  Patient is alert and oriented x2 and able to express her feelings.  Patient's daughter was at bedside, and is requesting that patient to go to SNF.  CSW spoke to patient's daughter and informed her that due to patient having Medicare A and B she has not had a 3 day qualifying stay, she would have to pay privately for SNF placement.  Patient's daughter expressed understanding, and would still like CSW to begin bed search process in [place].  CSW explained to patient's daughter what to expect at SNF and how patient's insurance will pay for her stay.  Employment status:  Retired Forensic scientist:  Medicare PT Recommendations:  [place] / Referral to community resources:  [place]  Patient/Family's Response to care: Patient and family are agreeable to going to SNF for short term rehab.  Patient/Family's Understanding of and Emotional Response to Diagnosis, Current Treatment, and Prognosis:  Patient's family aware of current prognosis and treatment plan.  Emotional Assessment Appearance:  Appears stated age Attitude/Demeanor/Rapport:    Affect (typically observed):  Appropriate, Calm Orientation:  Oriented to Self, Oriented to Place Alcohol / Substance use:  Not Applicable Psych involvement (Current and /or in the community):  No (Comment)  Discharge Needs  Concerns to be addressed:  Lack of Support Readmission within the last 30 days:  No Current discharge risk:  Lack of support system Barriers to Discharge:  Continued Medical Work up   Anell Barr 02/06/2017, 3:56 PM

## 2017-02-06 NOTE — Progress Notes (Signed)
[place] [facility] [place] NAME: Carol Vega    MR#:  [id number]  DATE OF BIRTH:  [birth year]  SUBJECTIVE:  Patient is pleasantly confused. Does not remember events from yesterday. Spoke with patient's daughter Carol Vega who lives with the patient. There is another sister who lives together. Patient had a mechanical fall yesterday sustained a right pubic pelvic fracture. Patient denies any pain at present, she had CHF yesterday with SOB, improved now. Have continued to have tachycardia. As per nurse- she needed to crush the tabs, and could not give long acting cardizem due to that.  REVIEW OF SYSTEMS:   Review of Systems  Unable to perform ROS: Mental acuity   Tolerating Diet: Tolerating PT:   DRUG ALLERGIES:  No Known Allergies  VITALS:  Blood pressure (!) 128/92, pulse (!) 121, temperature 98.1 F (36.7 C), temperature source Oral, resp. rate 16, height 5' (1.524 m), weight 41.7 kg (91 lb 14.4 oz), SpO2 99 %.  PHYSICAL EXAMINATION:   Physical Exam  GENERAL:  83 y.o.-year-old patient lying in the bed with no acute distress.  EYES: Pupils equal, round, reactive to light and accommodation. No scleral icterus. Extraocular muscles intact.  HEENT: Head atraumatic, normocephalic. Oropharynx and nasopharynx clear. Bruise above the right eye NECK:  Supple, no jugular venous distention. No thyroid enlargement, no tenderness.  LUNGS: Normal breath sounds bilaterally, no wheezing, rales, rhonchi. No use of accessory muscles of respiration.  CARDIOVASCULAR: S1, S2 fast. No murmurs, rubs, or gallops.  ABDOMEN: Soft, nontender, nondistended. Bowel sounds present. No organomegaly or mass.  EXTREMITIES: No cyanosis, clubbing or edema b/l.    NEUROLOGIC: Unable to participate completely secondary to pelvic fracture. Overall grossly nonfocal./l.   PSYCHIATRIC:  patient is alert and awake. Pleasant confusion.  SKIN: No obvious rash, lesion, or ulcer.    LABORATORY PANEL:  CBC  Recent Labs Lab 02/03/17 0855  WBC 9.3  HGB 11.8*  HCT 34.8*  PLT 260    Chemistries   Recent Labs Lab 02/03/17 0855  NA 138  K 4.4  CL 98*  CO2 33*  GLUCOSE 140*  BUN 40*  CREATININE 0.97  CALCIUM 9.3   Cardiac Enzymes  Recent Labs Lab 02/03/17 0855  TROPONINI <0.03   RADIOLOGY:  No results found. ASSESSMENT AND PLAN:  83 year old female with past medical history of osteoporosis, HTN, diverticulosis, atrial fibrillation, previous history of congestive heart failure, previous history of subdural hematoma who presented to the hospital a mechanical fall and noted to have a right pelvic fracture.  1. Status post fall and right-sided pelvic fracture-patient is in significant pain despite getting multiple doses of IV pain medications in the ER. - pain control and got a physical therapy evaluation. -Follow clinically, she  will need rehab services upon discharge.  2. History of atrial fibrillation- Have fast HR today, could not give long acting cardizem as could not crush.   Changed to short acting cardizem today.  3. Chronic anemia-continue iron supplements.  4. Osteoporosis-continue calcium supplements  5. Cystitis   Keflex oral for total 5 days.  6. Ac CHF   Responded to lasix.   Awaited Echo,.  Case discussed with Care Management/Social Worker. Management plans discussed with the patient, family and they are in agreement.  CODE STATUS:full  DVT Prophylaxis: lovenox  TOTAL TIME TAKING CARE OF THIS PATIENT: 35 minutes.  >50% time spent on counselling and coordination of care  POSSIBLE D/C IN 1-2DAYS, DEPENDING ON CLINICAL CONDITION.  Note: This dictation was prepared with Dragon dictation along with smaller phrase technology. Any transcriptional errors that result from this process are unintentional.  Carol Vega M.D on 02/06/2017 at 3:12 PM  Between 7am to 6pm - Pager - [phone]  After 6pm go to  [email or web address] - password [email or web address] [place] [facility]  Office  [phone]  CC: Primary care physician; Carol Pheasant, MD

## 2017-02-06 NOTE — Progress Notes (Signed)
Assumed care at 1500. Patient without complaints. Family at the bedside. Took meds with chocolate ice cream without incident.

## 2017-02-06 NOTE — Progress Notes (Signed)
HR is running in 120- increased cardizem dose, monitor in hospital one more night.

## 2017-02-07 LAB — CBC
HEMATOCRIT: 32.4 % — AB (ref 35.0–47.0)
HEMOGLOBIN: 11.2 g/dL — AB (ref 12.0–16.0)
MCH: 34.5 pg — AB (ref 26.0–34.0)
MCHC: 34.7 g/dL (ref 32.0–36.0)
MCV: 99.3 fL (ref 80.0–100.0)
Platelets: 280 10*3/uL (ref 150–440)
RBC: 3.26 MIL/uL — ABNORMAL LOW (ref 3.80–5.20)
RDW: 12.9 % (ref 11.5–14.5)
WBC: 11.6 10*3/uL — ABNORMAL HIGH (ref 3.6–11.0)

## 2017-02-07 LAB — URINALYSIS, COMPLETE (UACMP) WITH MICROSCOPIC
BACTERIA UA: NONE SEEN
BILIRUBIN URINE: NEGATIVE
Glucose, UA: NEGATIVE mg/dL
HGB URINE DIPSTICK: NEGATIVE
Ketones, ur: NEGATIVE mg/dL
NITRITE: NEGATIVE
PROTEIN: 100 mg/dL — AB
Specific Gravity, Urine: 1.016 (ref 1.005–1.030)
pH: 5 (ref 5.0–8.0)

## 2017-02-07 LAB — CREATININE, SERUM
CREATININE: 1.13 mg/dL — AB (ref 0.44–1.00)
GFR calc Af Amer: 47 mL/min — ABNORMAL LOW (ref >60)
GFR calc non Af Amer: 41 mL/min — ABNORMAL LOW (ref >60)

## 2017-02-07 LAB — ECHOCARDIOGRAM COMPLETE
Height: 60 in
Weight: 1470.4 oz

## 2017-02-07 MED ORDER — DILTIAZEM HCL 30 MG PO TABS
30.0000 mg | ORAL_TABLET | Freq: Three times a day (TID) | ORAL | Status: DC
  Administered 2017-02-07 – 2017-02-08 (×2): 30 mg via ORAL
  Filled 2017-02-07 (×2): qty 1

## 2017-02-07 MED ORDER — CHLORHEXIDINE GLUCONATE 0.12 % MT SOLN
15.0000 mL | Freq: Two times a day (BID) | OROMUCOSAL | Status: DC
  Administered 2017-02-07 – 2017-02-08 (×3): 15 mL via OROMUCOSAL
  Filled 2017-02-07 (×3): qty 15

## 2017-02-07 MED ORDER — ORAL CARE MOUTH RINSE
15.0000 mL | Freq: Two times a day (BID) | OROMUCOSAL | Status: DC
  Administered 2017-02-07 – 2017-02-08 (×3): 15 mL via OROMUCOSAL

## 2017-02-07 NOTE — Evaluation (Signed)
Clinical/Bedside Swallow Evaluation Patient Details  Name: Carol Vega MRN: [id number] Date of Birth: [birth year]  Today's Date: 02/07/2017 Time: SLP Start Time (ACUTE ONLY): 1000 SLP Stop Time (ACUTE ONLY): 1100 SLP Time Calculation (min) (ACUTE ONLY): 60 min  Past Medical History:  Past Medical History:  Diagnosis Date  . Aneurysm ([place])    inter atrial septum  . Atrial fibrillation ([place])   . CHF (congestive heart failure) (HCC)    h/o pleural effusions  . Diverticulosis   . Hypertension   . Osteoporosis   . Subdural hematoma [facility])    admitted 2007   Past Surgical History:  Past Surgical History:  Procedure Laterality Date  . BREAST BIOPSY     benign  . TONSILECTOMY/ADENOIDECTOMY WITH MYRINGOTOMY  1930   HPI:  Pt  is a 83 y.o. female with a known history of Atrial fibrillation, hypertension, osteoporosis, previous history of subdural hematoma, history of CHF who presents to the hospital due to a fall and noted to have a right-sided pelvic fracture. Patient herself has mild dementia and therefore is a very poor historian therefore most of the history obtained from the daughter at bedside. As per the daughter patient does ambulate at home holding Onto the walls and furniture but does not use a walker or cane. She has not had a fall recently. Today she was ambulating to the bathroom and fell and was having significant pain and therefore brought to the ER for further evaluation. Patient underwent a trauma workup including a CT of the head and cervical spine also x-rays which showed a pelvic fracture on the right side and the hospitalist services were contacted further treatment and evaluation. Per report, Daughter gives pt a more blended, liquid diet at home for "easier swallowing". Noted Barium (Esophagus) study last year. Pt verbally conversive w/ SLP w/ some confusion noted but engaged w/ SLP talking about her childhood. Appeared min HOH.   Assessment / Plan / Recommendation Clinical  Impression  Pt appears at reduced risk for aspiration of thin liquids following general aspiration precautions; solids were not assessed as pt is eating more of a blended, liquid diet at home per Daughter's report(a Ninja blender is utilized to blend pt's foods so that she can take them via straw). Pt consumed small sips of water(~2-3 ozs total) w/ no overt s/s of aspiration noted; clear vocal quality b/t trials and no decline in respiratory status was noted. Oral phase appeared wfl for bolus management and clearing of the liquids. Pt fed self w/ setup assist. Of note, pt's upper denture plate was loose while she talked w/ SLP causing her to gag. After the 3rd gagging episode, pt expectorated a small amount of dark liquid which the Nurse suspected was the Chocolate Ensure from breakfast meal this morning. Pt appears to have Esophageal phase dysmotility and had a Barium study to assess her Esophageal motility in 09/2016. This may be related to pt's downgrade of diet consistency to a more liquid form by the Daughter(at home). Recommend continue w/ a Dysphagia level 1(puree) w/ thin liquids diet w/ continuing to blend her diet at home; general aspiration precautions; REFLUX precautions; Pills in Puree or CRUSHED if able for easier Esophageal clearing. No further skilled ST Services indicated at this time. NSG to reconsult if any change in status while admitted. NSG updated. Daughter updated briefly when she arrived after the evaluation. Recommend securing upper denture plate if planning to wear.   SLP Visit Diagnosis: Dysphagia, unspecified (R13.10) (Esophageal phase suspected)  Aspiration Risk   (reduced from an oropharyngeal standpoint w/ thins)    Diet Recommendation  Dysphagia level 1(puree) w/ thin liquids - continue her baseline diet consistency(more blended/liquids). General aspiration precautions; Reflux precautions  Medication Administration: Crushed with puree (whole in puree if able to clear  adequately)    Other  Recommendations Recommended Consults: Consider GI evaluation (for education and management recommendations) Oral Care Recommendations: Oral care BID;Staff/trained caregiver to provide oral care;Patient independent with oral care   Follow up Recommendations None      Frequency and Duration            Prognosis Prognosis for Safe Diet Advancement: Fair      Swallow Study   General Date of Onset: 02/03/17 HPI: Pt  is a 83 y.o. female with a known history of Atrial fibrillation, hypertension, osteoporosis, previous history of subdural hematoma, history of CHF who presents to the hospital due to a fall and noted to have a right-sided pelvic fracture. Patient herself has mild dementia and therefore is a very poor historian therefore most of the history obtained from the daughter at bedside. As per the daughter patient does ambulate at home holding Onto the walls and furniture but does not use a walker or cane. She has not had a fall recently. Today she was ambulating to the bathroom and fell and was having significant pain and therefore brought to the ER for further evaluation. Patient underwent a trauma workup including a CT of the head and cervical spine also x-rays which showed a pelvic fracture on the right side and the hospitalist services were contacted further treatment and evaluation. Per report, Daughter gives pt a more blended, liquid diet at home for "easier swallowing". Noted Barium (Esophagus) study last year. Pt verbally conversive w/ SLP w/ some confusion noted but engaged w/ SLP talking about her childhood. Appeared min HOH. Type of Study: Bedside Swallow Evaluation Previous Swallow Assessment: Barium (Esophagus) study in 09/2016 Diet Prior to this Study: Dysphagia 1 (puree);Thin liquids (blended diet) Temperature Spikes Noted: No (wbc 11.6) Respiratory Status: Nasal cannula (2 liters) History of Recent Intubation: No Behavior/Cognition:  Alert;Cooperative;Pleasant mood;Confused;Distractible;Requires cueing (min HOH) Oral Cavity Assessment: Dry (min) Oral Care Completed by SLP: Recent completion by staff Oral Cavity - Dentition: Dentures, top (only; no bottom dentition) Vision: Functional for self-feeding Self-Feeding Abilities: Able to feed self;Needs assist;Needs set up Patient Positioning: Upright in bed;Postural control adequate for testing Baseline Vocal Quality: Low vocal intensity;Normal Volitional Cough: Strong Volitional Swallow: Able to elicit    Oral/Motor/Sensory Function Overall Oral Motor/Sensory Function: Within functional limits   Ice Chips Ice chips: Within functional limits Presentation: Spoon (fed; 2 trials)   Thin Liquid Thin Liquid: Within functional limits Presentation: Self Fed;Straw (~3 ozs total w/ time ) Other Comments: only takes a more liquid based diet at home per report    Nectar Thick Nectar Thick Liquid: Not tested   Honey Thick Honey Thick Liquid: Not tested   Puree Puree: Not tested   Solid   GO   Solid: Not tested         Orinda Kenner, MS, CCC-SLP Emaan Gary 02/07/2017,12:38 PM

## 2017-02-07 NOTE — Progress Notes (Signed)
[facility] [place] NAME: Carol Vega    MR#:  [id number]  DATE OF BIRTH:  1921-07-29  SUBJECTIVE:  Patient is pleasantly confused. Does not remember events from yesterday. Spoke with patient's daughter Izora Gala who lives with the patient. There is another sister who lives together. Patient had a mechanical fall yesterday sustained a right pubic pelvic fracture. Patient denies any pain at present, she had CHF yesterday with SOB, improved now. Have continued to have tachycardia. As per nurse- she needed to crush the tabs, and could not give long acting cardizem due to that.   Now heart rate is little better controlled, but blood pressure had gone down in last 24 hours with patient being slightly confused.  REVIEW OF SYSTEMS:   Review of Systems  Unable to perform ROS: Mental acuity   Tolerating Diet: Tolerating PT:   DRUG ALLERGIES:  No Known Allergies  VITALS:  Blood pressure 101/66, pulse 91, temperature 98.4 F (36.9 C), temperature source Oral, resp. rate 17, height 5' (1.524 m), weight 41.7 kg (91 lb 14.4 oz), SpO2 94 %.  PHYSICAL EXAMINATION:   Physical Exam  GENERAL:  83 y.o.-year-old patient lying in the bed with no acute distress.  EYES: Pupils equal, round, reactive to light and accommodation. No scleral icterus. Extraocular muscles intact.  HEENT: Head atraumatic, normocephalic. Oropharynx and nasopharynx clear. Bruise above the right eye NECK:  Supple, no jugular venous distention. No thyroid enlargement, no tenderness.  LUNGS: Normal breath sounds bilaterally, no wheezing, rales, rhonchi. No use of accessory muscles of respiration.  CARDIOVASCULAR: S1, S2 fast. No murmurs, rubs, or gallops.  ABDOMEN: Soft, nontender, nondistended. Bowel sounds present. No organomegaly or mass.  EXTREMITIES: No cyanosis, clubbing or edema b/l.    NEUROLOGIC: Unable to participate completely secondary to pelvic fracture. Overall grossly  nonfocal./l.   PSYCHIATRIC:  patient is alert and awake. Pleasant confusion.  SKIN: No obvious rash, lesion, or ulcer.   LABORATORY PANEL:  CBC  Recent Labs Lab 02/07/17 0343  WBC 11.6*  HGB 11.2*  HCT 32.4*  PLT 280    Chemistries   Recent Labs Lab 02/03/17 0855 02/07/17 0343  NA 138  --   K 4.4  --   CL 98*  --   CO2 33*  --   GLUCOSE 140*  --   BUN 40*  --   CREATININE 0.97 1.13*  CALCIUM 9.3  --    Cardiac Enzymes  Recent Labs Lab 02/03/17 0855  TROPONINI <0.03   RADIOLOGY:  No results found. ASSESSMENT AND PLAN:  83 year old female with past medical history of osteoporosis, HTN, diverticulosis, atrial fibrillation, previous history of congestive heart failure, previous history of subdural hematoma who presented to the hospital a mechanical fall and noted to have a right pelvic fracture.  1. Status post fall and right-sided pelvic fracture-patient is in significant pain despite getting multiple doses of IV pain medications in the ER. - pain control and got a physical therapy evaluation. -Follow clinically, she  will need rehab services upon discharge.  2. History of atrial fibrillation- Have fast HR , could not give long acting cardizem as could not crush.   Changed to short acting cardizem .   Her BP is running on lower side, so decreased dose of cardizem.  3. Chronic anemia-continue iron supplements.  4. Osteoporosis-continue calcium supplements  5. Cystitis   Keflex oral for total 5 days.   Recheck UA , and if needed  culture, as cx was not sent on admission.  6. Ac diastolic CHF   Responded to lasix.   reviewed Echo,. EF 65%, there is some echo genic structure in right atreum.   Because of 83 year old age and fall and fracture she is not a candidate to tolerate the procedure off TEE or any kind of anticoagulation.   I discussed this in detail with her daughter and she understand and agree with the plan currently, and she would discuss that  with her cardiologist in future appointment.  Case discussed with Care Management/Social Worker. Management plans discussed with the patient, family and they are in agreement.  CODE STATUS:full  DVT Prophylaxis: lovenox  TOTAL TIME TAKING CARE OF THIS PATIENT: 35 minutes.  >50% time spent on counselling and coordination of care  POSSIBLE D/C IN 1-2DAYS, DEPENDING ON CLINICAL CONDITION.  Note: This dictation was prepared with Dragon dictation along with smaller phrase technology. Any transcriptional errors that result from this process are unintentional.  Vaughan Basta M.D on 02/07/2017 at 1:53 PM  Between 7am to 6pm - Pager - [phone]  After 6pm go to [email or web address] - password [email or web address] [facility]  Office  [phone]  CC: Primary care physician; Einar Pheasant, MD

## 2017-02-07 NOTE — Clinical Social Work Placement (Signed)
   CLINICAL SOCIAL WORK PLACEMENT  NOTE  Date:  02/07/2017  Patient Details  Name: Carol Vega MRN: [id number] Date of Birth: Apr 01, 1921  Clinical Social Work is seeking post-discharge placement for this patient at the [place] level of care (*CSW will initial, date and re-position this form in  chart as items are completed):  Yes   Patient/family provided with [place] Work Department's list of facilities offering this level of care within the geographic area requested by the patient (or if unable, by the patient's family).  Yes   Patient/family informed of their freedom to choose among providers that offer the needed level of care, that participate in Medicare, Medicaid or managed care program needed by the patient, have an available bed and are willing to accept the patient.  Yes   Patient/family informed of [place]'s ownership interest in [facility] and [facility], as well as of the fact that they are under no obligation to receive care at these facilities.  PASRR submitted to EDS on 02/06/17     PASRR number received on 02/06/17     Existing PASRR number confirmed on       FL2 transmitted to all facilities in geographic area requested by pt/family on 02/06/17     FL2 transmitted to all facilities within larger geographic area on       Patient informed that his/her managed care company has contracts with or will negotiate with certain facilities, including the following:        Yes   Patient/family informed of bed offers received.  Patient chooses bed at  [facility])     Physician recommends and patient chooses bed at      Patient to be transferred to  [facility]) on  .  Patient to be transferred to facility by       Patient family notified on   of transfer.  Name of family member notified:        PHYSICIAN Please sign FL2     Additional Comment:     _______________________________________________ Danie Chandler, Student-Social Work 02/07/2017, 12:04 PM

## 2017-02-07 NOTE — Progress Notes (Signed)
Social work Theatre manager met with patient and patients daughter, Izora Gala at bedside. PT is recommending SNF. Social work Theatre manager presented bed offers. Patients daughter has chose [facility]. Magda Paganini, admissions coordinator at [facility] is aware of above. Plans are for patient to discharge tomorrow pending medical clearance. Social work Theatre manager will continue to assist and follow as needed.   Danae Chen, Social Work Intern [phone]

## 2017-02-07 NOTE — Progress Notes (Signed)
Physical Therapy Treatment Patient Details Name: Carol Vega MRN: [id number] DOB: 13-Apr-1921 Today's Date: 02/07/2017    History of Present Illness 83 yo female with onset of pelvic fracture after recent fall, pain and decreased gait.  Per daughter was independent to walk at home all over the house.  PMHx:  cardiomegaly, pleural effusion, COPD, dizziness, DJD c-spine    PT Comments    Pt with emesis this am but agrees to session.  No further c/o nausea or vomiting during session.  Participated in exercises as described below.  Pt was max a x 2/dependant for all bed mobility and transfer to recliner at bedside.  Overall decreased pain with movement and weight bearing today.  "It doesn't hurt at all".  She continues to require extensive assist.  SNF remains an appropriate discharge plan.   Follow Up Recommendations  SNF     Equipment Recommendations  None recommended by PT    Recommendations for Other Services       Precautions / Restrictions Precautions Precautions: Fall Restrictions Weight Bearing Restrictions: No    Mobility  Bed Mobility Overal bed mobility: Needs Assistance Bed Mobility: Supine to Sit     Supine to sit: Max assist;+2 for physical assistance        Transfers Overall transfer level: Needs assistance Equipment used: None Transfers: Sit to/from Stand;Stand Pivot Transfers Sit to Stand: +2 physical assistance;Max assist Stand pivot transfers: Total assist       General transfer comment: pt with no c/o pain in RLE today and was able to bear weight for transfer  Ambulation/Gait             General Gait Details: unable   Stairs            Wheelchair Mobility    Modified Rankin (Stroke Patients Only)       Balance Overall balance assessment: History of Falls;Needs assistance   Sitting balance-Leahy Scale: Poor       Standing balance-Leahy Scale: Zero                      Cognition Arousal/Alertness:  Awake/alert Behavior During Therapy: WFL for tasks assessed/performed Overall Cognitive Status: No family/caregiver present to determine baseline cognitive functioning                      Exercises Other Exercises Other Exercises: BLE AAROM x 10 for ankle pumps, heel slides, ab/add, SLR with no c/o pain    General Comments        Pertinent Vitals/Pain Pain Assessment: No/denies pain    Home Living                      Prior Function            PT Goals (current goals can now be found in the care plan section) Progress towards PT goals: Progressing toward goals    Frequency           PT Plan      Co-evaluation             End of Session Equipment Utilized During Treatment: Gait belt;Oxygen Activity Tolerance: Patient tolerated treatment well Patient left: in chair;with call bell/phone within reach;with chair alarm set;with family/visitor present Nurse Communication: Mobility status       Time: 9675-9163 PT Time Calculation (min) (ACUTE ONLY): 14 min  Charges:  $Therapeutic Activity: 8-22 mins  G Codes:       Chesley Noon, PTA 02/07/17, 11:38 AM

## 2017-02-08 DIAGNOSIS — F0390 Unspecified dementia without behavioral disturbance: Secondary | ICD-10-CM | POA: Diagnosis not present

## 2017-02-08 DIAGNOSIS — I10 Essential (primary) hypertension: Secondary | ICD-10-CM | POA: Diagnosis not present

## 2017-02-08 DIAGNOSIS — E870 Hyperosmolality and hypernatremia: Secondary | ICD-10-CM | POA: Diagnosis not present

## 2017-02-08 DIAGNOSIS — K219 Gastro-esophageal reflux disease without esophagitis: Secondary | ICD-10-CM | POA: Diagnosis not present

## 2017-02-08 DIAGNOSIS — Z9911 Dependence on respirator [ventilator] status: Secondary | ICD-10-CM | POA: Diagnosis not present

## 2017-02-08 DIAGNOSIS — I509 Heart failure, unspecified: Secondary | ICD-10-CM | POA: Diagnosis not present

## 2017-02-08 DIAGNOSIS — J9611 Chronic respiratory failure with hypoxia: Secondary | ICD-10-CM | POA: Diagnosis not present

## 2017-02-08 DIAGNOSIS — K5900 Constipation, unspecified: Secondary | ICD-10-CM | POA: Diagnosis not present

## 2017-02-08 DIAGNOSIS — W19XXXD Unspecified fall, subsequent encounter: Secondary | ICD-10-CM | POA: Diagnosis not present

## 2017-02-08 DIAGNOSIS — R627 Adult failure to thrive: Secondary | ICD-10-CM | POA: Diagnosis not present

## 2017-02-08 DIAGNOSIS — I481 Persistent atrial fibrillation: Secondary | ICD-10-CM | POA: Diagnosis not present

## 2017-02-08 DIAGNOSIS — I110 Hypertensive heart disease with heart failure: Secondary | ICD-10-CM | POA: Diagnosis not present

## 2017-02-08 DIAGNOSIS — S329XXA Fracture of unspecified parts of lumbosacral spine and pelvis, initial encounter for closed fracture: Secondary | ICD-10-CM | POA: Diagnosis not present

## 2017-02-08 DIAGNOSIS — R102 Pelvic and perineal pain: Secondary | ICD-10-CM | POA: Diagnosis not present

## 2017-02-08 DIAGNOSIS — M8000XD Age-related osteoporosis with current pathological fracture, unspecified site, subsequent encounter for fracture with routine healing: Secondary | ICD-10-CM | POA: Diagnosis not present

## 2017-02-08 DIAGNOSIS — N390 Urinary tract infection, site not specified: Secondary | ICD-10-CM | POA: Diagnosis not present

## 2017-02-08 DIAGNOSIS — W1830XA Fall on same level, unspecified, initial encounter: Secondary | ICD-10-CM | POA: Diagnosis not present

## 2017-02-08 DIAGNOSIS — D649 Anemia, unspecified: Secondary | ICD-10-CM | POA: Diagnosis not present

## 2017-02-08 DIAGNOSIS — R110 Nausea: Secondary | ICD-10-CM | POA: Diagnosis not present

## 2017-02-08 DIAGNOSIS — Z7401 Bed confinement status: Secondary | ICD-10-CM | POA: Diagnosis not present

## 2017-02-08 DIAGNOSIS — R6889 Other general symptoms and signs: Secondary | ICD-10-CM | POA: Diagnosis not present

## 2017-02-08 DIAGNOSIS — I5032 Chronic diastolic (congestive) heart failure: Secondary | ICD-10-CM | POA: Diagnosis not present

## 2017-02-08 DIAGNOSIS — S51011A Laceration without foreign body of right elbow, initial encounter: Secondary | ICD-10-CM | POA: Diagnosis not present

## 2017-02-08 DIAGNOSIS — Z23 Encounter for immunization: Secondary | ICD-10-CM | POA: Diagnosis not present

## 2017-02-08 DIAGNOSIS — M810 Age-related osteoporosis without current pathological fracture: Secondary | ICD-10-CM | POA: Diagnosis not present

## 2017-02-08 DIAGNOSIS — E43 Unspecified severe protein-calorie malnutrition: Secondary | ICD-10-CM | POA: Diagnosis not present

## 2017-02-08 DIAGNOSIS — N179 Acute kidney failure, unspecified: Secondary | ICD-10-CM | POA: Diagnosis not present

## 2017-02-08 DIAGNOSIS — I4891 Unspecified atrial fibrillation: Secondary | ICD-10-CM | POA: Diagnosis not present

## 2017-02-08 DIAGNOSIS — Y92002 Bathroom of unspecified non-institutional (private) residence single-family (private) house as the place of occurrence of the external cause: Secondary | ICD-10-CM | POA: Diagnosis not present

## 2017-02-08 DIAGNOSIS — S32501D Unspecified fracture of right pubis, subsequent encounter for fracture with routine healing: Secondary | ICD-10-CM | POA: Diagnosis not present

## 2017-02-08 DIAGNOSIS — S32501A Unspecified fracture of right pubis, initial encounter for closed fracture: Secondary | ICD-10-CM | POA: Diagnosis not present

## 2017-02-08 MED ORDER — METOPROLOL TARTRATE 25 MG PO TABS: 12.5000 mg | ORAL_TABLET | Freq: Two times a day (BID) | ORAL | Status: DC

## 2017-02-08 MED ORDER — ORAL CARE MOUTH RINSE
15.0000 mL | Freq: Two times a day (BID) | OROMUCOSAL | Status: DC
  Administered 2017-02-08 (×2): 15 mL via OROMUCOSAL

## 2017-02-08 MED ORDER — METOPROLOL TARTRATE 25 MG PO TABS: 12.5000 mg | ORAL_TABLET | Freq: Two times a day (BID) | ORAL | 0 refills | Status: AC

## 2017-02-08 NOTE — Clinical Social Work Placement (Signed)
   CLINICAL SOCIAL WORK PLACEMENT  NOTE  Date:  02/08/2017  Patient Details  Name: Carol Vega MRN: [id number] Date of Birth: [birth year]  Clinical Social Work is seeking post-discharge placement for this patient at the [place] level of care (*CSW will initial, date and re-position this form in  chart as items are completed):  Yes   Patient/family provided with [place] Work Department's list of facilities offering this level of care within the geographic area requested by the patient (or if unable, by the patient's family).  Yes   Patient/family informed of their freedom to choose among providers that offer the needed level of care, that participate in Medicare, Medicaid or managed care program needed by the patient, have an available bed and are willing to accept the patient.  Yes   Patient/family informed of [place]'s ownership interest in [facility] and [facility], as well as of the fact that they are under no obligation to receive care at these facilities.  PASRR submitted to EDS on 02/06/17     PASRR number received on 02/06/17     Existing PASRR number confirmed on       FL2 transmitted to all facilities in geographic area requested by pt/family on 02/06/17     FL2 transmitted to all facilities within larger geographic area on       Patient informed that his/her managed care company has contracts with or will negotiate with certain facilities, including the following:        Yes   Patient/family informed of bed offers received.  Patient chooses bed at  [facility])     Physician recommends and patient chooses bed at      Patient to be transferred to  ([facility]) on 02/08/17.  Patient to be transferred to facility by  [facility] )     Patient family notified on 02/08/17 of transfer.  Name of family member notified:   (Patient's daughter Izora Gala is aware of D/C today. )     PHYSICIAN        Additional Comment:    _______________________________________________ Shiane Wenberg, Veronia Beets, LCSW 02/08/2017, 2:26 PM

## 2017-02-08 NOTE — Progress Notes (Signed)
Patient is alert and oriented and able to verbalize needs. No complaints of pain at this time. Vital signs stable. AVS sent with patient. PIV discontinued. Report called in to nurse at [facility] and family made aware of transport.

## 2017-02-08 NOTE — Progress Notes (Signed)
Patient is medically stable for D/C to [facility] today. Per [facility] admissions coordinator at [facility] patient can come today to room 506. RN will call report to 500 hall and arrange EMS for transport. Clinical Education officer, museum (CSW) sent D/C orders to [facility] via [place]. CSW contacted patient's daughter Carol Vega and made her aware of above. Carol Vega is aware that medicare will now pay for SNF because patient has had a 3 night inpatient stay. Please reconsult if future social work needs arise. CSW signing off.   [employer], LCSW [phone]

## 2017-02-08 NOTE — Discharge Summary (Signed)
[facility] [place] NAME: Carol Vega    MR#:  [id number]  DATE OF BIRTH:  05/31/1921  DATE OF ADMISSION:  02/03/2017 ADMITTING PHYSICIAN: Henreitta Leber, MD  DATE OF DISCHARGE: 02/08/17  PRIMARY CARE PHYSICIAN: Einar Pheasant, MD    ADMISSION DIAGNOSIS:  Acute cystitis without hematuria [N30.00] Fall, initial encounter [W19.XXXA] Closed nondisplaced fracture of pelvis, unspecified part of pelvis, initial encounter ([place]) [S32.9XXA]  DISCHARGE DIAGNOSIS:  Right pelvic fracture Acute Cystitis Acute CHF diastolic-improved SECONDARY DIAGNOSIS:   Past Medical History:  Diagnosis Date  . Aneurysm ([place])    inter atrial septum  . Atrial fibrillation ([place])   . CHF (congestive heart failure) (HCC)    h/o pleural effusions  . Diverticulosis   . Hypertension   . Osteoporosis   . Subdural hematoma [facility])    admitted 2007    HOSPITAL COURSE:   83 year old female with past medical history of osteoporosis, HTN, diverticulosis, atrial fibrillation history of subdural hematoma who presented to the hospital a mechanical fall and noted to have a right pelvic fracture.  83 year old female with past medical history of osteoporosis, HTN, diverticulosis, atrial fibrillation, previous history of congestive heart failure, previous history of subdural hematoma who presented to the hospital a mechanical fall and noted to have a right pelvic fracture.  1. Status post fall and right-sided pelvic fracture-patient is in significant pain despite getting multiple doses of IV pain medications in the ER. - pain control and got a physical therapy evaluation. -Follow clinically, she  will need rehab services upon discharge.  2. History of atrial fibrillation-mild tachycardia -low dose BB  3. Chronic anemia-continue iron supplements.  4. Osteoporosis-continue calcium supplements  5. Cystitis   completed Keflex oral for total 5 days.  6. Ac  diastolic CHF   Responded to lasix.   reviewed Echo,. EF 65%, there is some echogenic structure in right atrium.   Due to old age and fall and fracture she is not a candidate to tolerate the procedure of TEE or any kind of anticoagulation.   Dr Marthann Schiller discussed this in detail with her daughter and she understand and agree with the plan currently, and she would discuss that with her cardiologist in future appointment.  Overall stable for d/c to rehab today Left message for dter Ms Carol Vega CONSULTS OBTAINED:  Treatment Team:  Fritzi Mandes, MD  DRUG ALLERGIES:  No Known Allergies  DISCHARGE MEDICATIONS:   Current Discharge Medication List    START taking these medications   Details  feeding supplement, ENSURE ENLIVE, (ENSURE ENLIVE) LIQD Take 237 mLs by mouth 3 (three) times daily with meals. Qty: 237 mL, Refills: 12    HYDROcodone-acetaminophen (NORCO/VICODIN) 5-325 MG tablet Take 1-2 tablets by mouth every 4 (four) hours as needed for moderate pain. Qty: 25 tablet, Refills: 0    metoprolol tartrate (LOPRESSOR) 25 MG tablet Take 0.5 tablets (12.5 mg total) by mouth 2 (two) times daily. Qty: 60 tablet, Refills: 0      CONTINUE these medications which have NOT CHANGED   Details  Ascorbic Acid (VITAMIN C PO) Take by mouth.    CALCIUM-MAGNESIUM PO Take by mouth daily.    Cholecalciferol (VITAMIN D PO) Take 1 tablet by mouth daily.    Cyanocobalamin (VITAMIN B-12 PO) Take 1 Dose by mouth daily.    ferrous sulfate 325 (65 FE) MG tablet TAKE ONE (1) TABLET BY MOUTH 3 TIMES DAILY WITH FOOD Qty: 90 tablet, Refills:  4    Multiple Vitamins-Minerals (PRESERVISION AREDS 2) CAPS Take by mouth 2 (two) times daily.    Omega-3 Fatty Acids (FISH OIL) 1000 MG CAPS Take 1 capsule by mouth daily.    NEXIUM 40 MG capsule TAKE ONE CAPSULE EVERY DAY AT NOON. Qty: 30 capsule, Refills: 3      STOP taking these medications     diltiazem (CARDIZEM CD) 120 MG 24 hr capsule      losartan  (COZAAR) 50 MG tablet         If you experience worsening of your admission symptoms, develop shortness of breath, life threatening emergency, suicidal or homicidal thoughts you must seek medical attention immediately by calling 911 or calling your MD immediately  if symptoms less severe.  You Must read complete instructions/literature along with all the possible adverse reactions/side effects for all the Medicines you take and that have been prescribed to you. Take any new Medicines after you have completely understood and accept all the possible adverse reactions/side effects.   Please note  You were cared for by a hospitalist during your hospital stay. If you have any questions about your discharge medications or the care you received while you were in the hospital after you are discharged, you can call the unit and asked to speak with the hospitalist on call if the hospitalist that took care of you is not available. Once you are discharged, your primary care physician will handle any further medical issues. Please note that NO REFILLS for any discharge medications will be authorized once you are discharged, as it is imperative that you return to your primary care physician (or establish a relationship with a primary care physician if you do not have one) for your aftercare needs so that they can reassess your need for medications and monitor your lab values. Today   SUBJECTIVE   No new complaints  VITAL SIGNS:  Blood pressure 122/78, pulse (!) 115, temperature 98.1 F (36.7 C), temperature source Oral, resp. rate 16, height 5' (1.524 m), weight 41.7 kg (91 lb 14.4 oz), SpO2 98 %.  I/O:    Intake/Output Summary (Last 24 hours) at 02/08/17 0759 Last data filed at 02/07/17 1815  [place] per 24 hour  Intake                0 ml  Output              100 ml  Net             -100 ml    PHYSICAL EXAMINATION:  GENERAL:  83 y.o.-year-old patient lying in the bed with no acute distress. thin   EYES: Pupils equal, round, reactive to light and accommodation. No scleral icterus. Extraocular muscles intact.  HEENT: Head atraumatic, normocephalic. Oropharynx and nasopharynx clear.  NECK:  Supple, no jugular venous distention. No thyroid enlargement, no tenderness.  LUNGS: Normal breath sounds bilaterally, no wheezing, rales,rhonchi or crepitation. No use of accessory muscles of respiration.  CARDIOVASCULAR: S1, S2 normal. No murmurs, rubs, or gallops.  ABDOMEN: Soft, non-tender, non-distended. Bowel sounds present. No organomegaly or mass.  EXTREMITIES: No pedal edema, cyanosis, or clubbing.  NEUROLOGIC: Cranial nerves II through XII are intact. Muscle strength 5/5 in all extremities. Sensation intact. Gait not checked.  PSYCHIATRIC: The patient is alert and oriented x 3.  SKIN: No obvious rash, lesion, or ulcer.   DATA REVIEW:   CBC   Recent Labs Lab 02/07/17 0343  WBC 11.6*  HGB 11.2*  HCT 32.4*  PLT 280    Chemistries   Recent Labs Lab 02/03/17 0855 02/07/17 0343  NA 138  --   K 4.4  --   CL 98*  --   CO2 33*  --   GLUCOSE 140*  --   BUN 40*  --   CREATININE 0.97 1.13*  CALCIUM 9.3  --     Microbiology Results   No results found for this or any previous visit (from the past 240 hour(s)).  RADIOLOGY:  No results found.   Management plans discussed with the patient, family and they are in agreement.  CODE STATUS:     Code Status Orders        Start     Ordered   02/03/17 1848  Full code  Continuous     02/03/17 1847    Code Status History    Date Active Date Inactive Code Status Order ID Comments User Context   This patient has a current code status but no historical code status.    Advance Directive Documentation     Most Recent Value  Type of Advance Directive  Healthcare Power of Attorney  Pre-existing out of facility DNR order (yellow form or pink MOST form)  -  "MOST" Form in Place?  -      TOTAL TIME TAKING CARE OF THIS PATIENT:  40  minutes.    Anaisa Radi M.D on 02/08/2017 at 7:59 AM  Between 7am to 6pm - Pager - [phone] After 6pm go to [email or web address] - password [email or web address] [facility]  Office  [phone]  CC: Primary care physician; Einar Pheasant, MD

## 2017-02-09 ENCOUNTER — Telehealth: Payer: Self-pay | Admitting: Internal Medicine

## 2017-02-09 DIAGNOSIS — I481 Persistent atrial fibrillation: Secondary | ICD-10-CM | POA: Diagnosis not present

## 2017-02-09 DIAGNOSIS — E43 Unspecified severe protein-calorie malnutrition: Secondary | ICD-10-CM | POA: Diagnosis not present

## 2017-02-09 DIAGNOSIS — J9611 Chronic respiratory failure with hypoxia: Secondary | ICD-10-CM | POA: Diagnosis not present

## 2017-02-09 DIAGNOSIS — M8000XD Age-related osteoporosis with current pathological fracture, unspecified site, subsequent encounter for fracture with routine healing: Secondary | ICD-10-CM | POA: Diagnosis not present

## 2017-02-09 DIAGNOSIS — I5032 Chronic diastolic (congestive) heart failure: Secondary | ICD-10-CM | POA: Diagnosis not present

## 2017-02-09 DIAGNOSIS — I10 Essential (primary) hypertension: Secondary | ICD-10-CM | POA: Diagnosis not present

## 2017-02-09 NOTE — Telephone Encounter (Signed)
Patient discharged for [facility] on 02/08/17 DX Non- displaced fracture of pelvis, Cystitis, cystitis. First attempt made to TCM for HFU left message for patient DPR to return call to office will continue to monitor and call.

## 2017-02-09 NOTE — Telephone Encounter (Signed)
Patient daughter( DPR) return call and stated patient was discharged to [facility] for Rehab. Will be admitted at least 20 day and patient DPR will return call at discharge to set up a follow up appointment. I have canceled the lab appointment patient had scheduled for  Next week and the appointment with PCP due to patient will still be in [facility].

## 2017-02-10 DIAGNOSIS — S329XXA Fracture of unspecified parts of lumbosacral spine and pelvis, initial encounter for closed fracture: Secondary | ICD-10-CM | POA: Diagnosis not present

## 2017-02-10 DIAGNOSIS — I4891 Unspecified atrial fibrillation: Secondary | ICD-10-CM | POA: Diagnosis not present

## 2017-02-10 DIAGNOSIS — K5900 Constipation, unspecified: Secondary | ICD-10-CM | POA: Diagnosis not present

## 2017-02-10 DIAGNOSIS — R110 Nausea: Secondary | ICD-10-CM | POA: Diagnosis not present

## 2017-02-10 DIAGNOSIS — N390 Urinary tract infection, site not specified: Secondary | ICD-10-CM | POA: Diagnosis not present

## 2017-02-15 ENCOUNTER — Other Ambulatory Visit: Payer: Medicare Other

## 2017-02-15 DIAGNOSIS — N179 Acute kidney failure, unspecified: Secondary | ICD-10-CM | POA: Diagnosis not present

## 2017-02-20 ENCOUNTER — Other Ambulatory Visit: Payer: Self-pay | Admitting: *Deleted

## 2017-02-20 ENCOUNTER — Ambulatory Visit: Payer: Medicare Other | Admitting: Podiatry

## 2017-02-20 ENCOUNTER — Ambulatory Visit: Payer: Medicare Other | Admitting: Internal Medicine

## 2017-02-20 NOTE — Patient Outreach (Signed)
Spoke with Drue Novel, SW at facility regarding patient plan. She reports that patient will go home with Hospice later in week.   Plan to sign off as patient will discharge with Hospice services. No [facility] care management needs assessed. Royetta Crochet. Laymond Purser, RN, BSN, [place] [phone]) Business Cell  [phone]) Toll Free Office

## 2017-02-21 DIAGNOSIS — E870 Hyperosmolality and hypernatremia: Secondary | ICD-10-CM | POA: Diagnosis not present

## 2017-02-21 DIAGNOSIS — S329XXA Fracture of unspecified parts of lumbosacral spine and pelvis, initial encounter for closed fracture: Secondary | ICD-10-CM | POA: Diagnosis not present

## 2017-02-21 DIAGNOSIS — R627 Adult failure to thrive: Secondary | ICD-10-CM | POA: Diagnosis not present

## 2017-02-22 ENCOUNTER — Telehealth: Payer: Self-pay | Admitting: *Deleted

## 2017-02-22 NOTE — Telephone Encounter (Signed)
Found signed copy of form. Faxed to hospice along with requested items

## 2017-02-22 NOTE — Telephone Encounter (Signed)
Please advise 

## 2017-02-22 NOTE — Telephone Encounter (Signed)
Spoke with Carol Vega, she will fax order over for completion.

## 2017-02-22 NOTE — Telephone Encounter (Signed)
Denise from [facility] has requested orders for Hospice. Patient was discharged from rehab to home.  [employer] [phone] Fax [phone]

## 2017-02-22 NOTE — Telephone Encounter (Signed)
Ok orders for hospice.  Let me know if I need to do anything.

## 2017-02-24 DIAGNOSIS — Z9181 History of falling: Secondary | ICD-10-CM | POA: Diagnosis not present

## 2017-02-24 DIAGNOSIS — S329XXD Fracture of unspecified parts of lumbosacral spine and pelvis, subsequent encounter for fracture with routine healing: Secondary | ICD-10-CM | POA: Diagnosis not present

## 2017-02-24 DIAGNOSIS — Z9981 Dependence on supplemental oxygen: Secondary | ICD-10-CM | POA: Diagnosis not present

## 2017-02-24 DIAGNOSIS — E43 Unspecified severe protein-calorie malnutrition: Secondary | ICD-10-CM | POA: Diagnosis not present

## 2017-02-24 DIAGNOSIS — R634 Abnormal weight loss: Secondary | ICD-10-CM | POA: Diagnosis not present

## 2017-02-24 DIAGNOSIS — M810 Age-related osteoporosis without current pathological fracture: Secondary | ICD-10-CM | POA: Diagnosis not present

## 2017-02-24 DIAGNOSIS — I110 Hypertensive heart disease with heart failure: Secondary | ICD-10-CM | POA: Diagnosis not present

## 2017-02-24 DIAGNOSIS — I4891 Unspecified atrial fibrillation: Secondary | ICD-10-CM | POA: Diagnosis not present

## 2017-02-24 DIAGNOSIS — D649 Anemia, unspecified: Secondary | ICD-10-CM | POA: Diagnosis not present

## 2017-02-24 DIAGNOSIS — F0390 Unspecified dementia without behavioral disturbance: Secondary | ICD-10-CM | POA: Diagnosis not present

## 2017-02-24 DIAGNOSIS — I509 Heart failure, unspecified: Secondary | ICD-10-CM | POA: Diagnosis not present

## 2017-02-24 DIAGNOSIS — K219 Gastro-esophageal reflux disease without esophagitis: Secondary | ICD-10-CM | POA: Diagnosis not present

## 2017-02-24 DIAGNOSIS — S30810D Abrasion of lower back and pelvis, subsequent encounter: Secondary | ICD-10-CM | POA: Diagnosis not present

## 2017-02-25 ENCOUNTER — Other Ambulatory Visit: Payer: Self-pay | Admitting: Family Medicine

## 2017-02-25 DIAGNOSIS — E43 Unspecified severe protein-calorie malnutrition: Secondary | ICD-10-CM | POA: Diagnosis not present

## 2017-02-25 DIAGNOSIS — F0390 Unspecified dementia without behavioral disturbance: Secondary | ICD-10-CM | POA: Diagnosis not present

## 2017-02-25 DIAGNOSIS — I110 Hypertensive heart disease with heart failure: Secondary | ICD-10-CM | POA: Diagnosis not present

## 2017-02-25 DIAGNOSIS — I509 Heart failure, unspecified: Secondary | ICD-10-CM | POA: Diagnosis not present

## 2017-02-25 DIAGNOSIS — R634 Abnormal weight loss: Secondary | ICD-10-CM | POA: Diagnosis not present

## 2017-02-25 DIAGNOSIS — S329XXD Fracture of unspecified parts of lumbosacral spine and pelvis, subsequent encounter for fracture with routine healing: Secondary | ICD-10-CM | POA: Diagnosis not present

## 2017-02-25 MED ORDER — MORPHINE SULFATE (CONCENTRATE) 20 MG/ML PO SOLN: ORAL | 0 refills | Status: AC

## 2017-02-25 MED ORDER — HYOSCYAMINE SULFATE 0.125 MG SL SUBL: 0.1250 mg | SUBLINGUAL_TABLET | SUBLINGUAL | 0 refills | Status: AC | PRN

## 2017-02-25 MED ORDER — LORAZEPAM 0.5 MG PO TABS: 0.5000 mg | ORAL_TABLET | ORAL | 0 refills | Status: AC | PRN

## 2017-02-25 NOTE — Progress Notes (Signed)
Patient actively dying. Does not have comfort care orders in place. Spoke with [facility] [place] Nurse [place]. Will fax orders to [phone] [employer]

## 2017-02-26 DIAGNOSIS — S329XXD Fracture of unspecified parts of lumbosacral spine and pelvis, subsequent encounter for fracture with routine healing: Secondary | ICD-10-CM | POA: Diagnosis not present

## 2017-02-26 DIAGNOSIS — E43 Unspecified severe protein-calorie malnutrition: Secondary | ICD-10-CM | POA: Diagnosis not present

## 2017-02-26 DIAGNOSIS — I509 Heart failure, unspecified: Secondary | ICD-10-CM | POA: Diagnosis not present

## 2017-02-26 DIAGNOSIS — R634 Abnormal weight loss: Secondary | ICD-10-CM | POA: Diagnosis not present

## 2017-02-26 DIAGNOSIS — I110 Hypertensive heart disease with heart failure: Secondary | ICD-10-CM | POA: Diagnosis not present

## 2017-02-26 DIAGNOSIS — F0390 Unspecified dementia without behavioral disturbance: Secondary | ICD-10-CM | POA: Diagnosis not present

## 2017-02-27 DIAGNOSIS — I110 Hypertensive heart disease with heart failure: Secondary | ICD-10-CM | POA: Diagnosis not present

## 2017-02-27 DIAGNOSIS — I509 Heart failure, unspecified: Secondary | ICD-10-CM | POA: Diagnosis not present

## 2017-02-27 DIAGNOSIS — R634 Abnormal weight loss: Secondary | ICD-10-CM | POA: Diagnosis not present

## 2017-02-27 DIAGNOSIS — E43 Unspecified severe protein-calorie malnutrition: Secondary | ICD-10-CM | POA: Diagnosis not present

## 2017-02-27 DIAGNOSIS — S329XXD Fracture of unspecified parts of lumbosacral spine and pelvis, subsequent encounter for fracture with routine healing: Secondary | ICD-10-CM | POA: Diagnosis not present

## 2017-02-27 DIAGNOSIS — F0390 Unspecified dementia without behavioral disturbance: Secondary | ICD-10-CM | POA: Diagnosis not present

## 2017-02-27 NOTE — Progress Notes (Signed)
Reviewed.  MD was called over weekend.  Please call Hospice and see if anything more needed.  Not sure of events over the weekend.

## 2017-02-27 NOTE — Progress Notes (Signed)
Called and spoke to triage nurse no other information needed regarding comfort meds at this time I have put a few orders in your blue folder for patient.

## 2017-02-27 NOTE — Progress Notes (Signed)
Faxed

## 2017-02-27 NOTE — Progress Notes (Signed)
Signed and placed in box.   

## 2017-02-28 DIAGNOSIS — R634 Abnormal weight loss: Secondary | ICD-10-CM | POA: Diagnosis not present

## 2017-02-28 DIAGNOSIS — I509 Heart failure, unspecified: Secondary | ICD-10-CM | POA: Diagnosis not present

## 2017-02-28 DIAGNOSIS — S329XXD Fracture of unspecified parts of lumbosacral spine and pelvis, subsequent encounter for fracture with routine healing: Secondary | ICD-10-CM | POA: Diagnosis not present

## 2017-02-28 DIAGNOSIS — I110 Hypertensive heart disease with heart failure: Secondary | ICD-10-CM | POA: Diagnosis not present

## 2017-02-28 DIAGNOSIS — E43 Unspecified severe protein-calorie malnutrition: Secondary | ICD-10-CM | POA: Diagnosis not present

## 2017-02-28 DIAGNOSIS — F0390 Unspecified dementia without behavioral disturbance: Secondary | ICD-10-CM | POA: Diagnosis not present

## 2017-03-01 ENCOUNTER — Telehealth: Payer: Self-pay | Admitting: *Deleted

## 2017-03-01 NOTE — Telephone Encounter (Signed)
[facility] reported a Death Notice. Pt passed on 03/18/2017 @11 :45 pm

## 2017-03-01 NOTE — Telephone Encounter (Signed)
Caryl Pina handed Dr. Nicki Reaper death certificate and Dr. Nicki Reaper placed it on her desk.

## 2017-03-01 NOTE — Telephone Encounter (Signed)
FYI

## 2017-03-21 DEATH — deceased

## 2017-03-24 ENCOUNTER — Telehealth: Payer: Self-pay

## 2017-03-24 NOTE — Telephone Encounter (Signed)
Signed and placed in your box.   

## 2017-03-24 NOTE — Telephone Encounter (Signed)
Faxed

## 2017-03-24 NOTE — Telephone Encounter (Signed)
Fax number [phone]

## 2017-03-24 NOTE — Telephone Encounter (Signed)
Fax received from Hospice that needs to be filled out and returned to day. I have put in [facility] folder for your signature.

## 2017-04-20 ENCOUNTER — Telehealth: Payer: Self-pay | Admitting: Internal Medicine

## 2017-04-20 NOTE — Telephone Encounter (Signed)
Left pt message asking to call Allison back directly at [phone] to schedule AWV. Thanks! °

## 2017-04-28 ENCOUNTER — Ambulatory Visit (INDEPENDENT_AMBULATORY_CARE_PROVIDER_SITE_OTHER): Payer: Medicare Other | Admitting: Vascular Surgery

## 2017-04-28 ENCOUNTER — Encounter (INDEPENDENT_AMBULATORY_CARE_PROVIDER_SITE_OTHER): Payer: Medicare Other

## 2017-05-25 NOTE — Telephone Encounter (Signed)
Left pt message asking to call Allison back directly at [phone] to schedule AWV. Thanks! °

## 2018-04-18 IMAGING — CR DG WRIST COMPLETE 3+V*R*
1 series · 5 of 5 positions shown · non-contrast
Comparison: None.

CLINICAL DATA: Fall, wrist pain

EXAM:
RIGHT WRIST - COMPLETE 3+ VIEW

[Series 1: dg wrist complete right · 0.14mm/px · 5 of 5 slices shown]
[im 1/5]
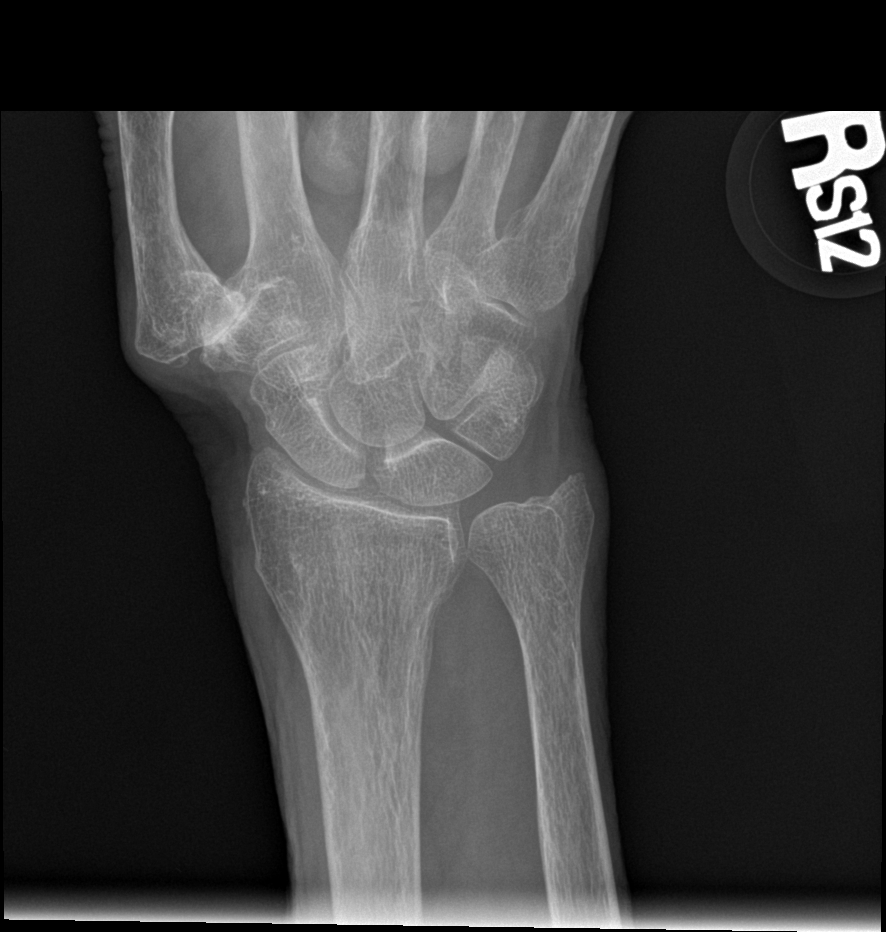
[im 2/5]
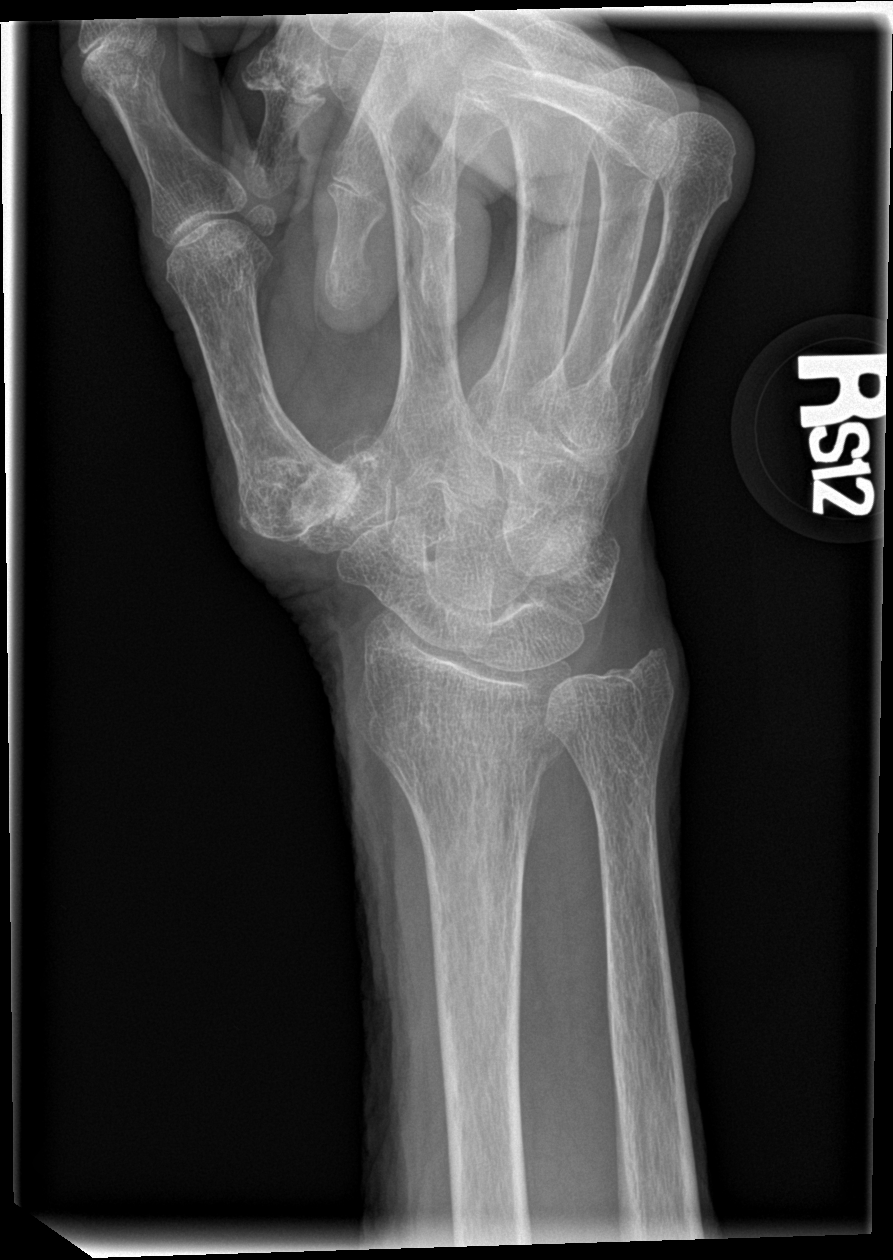
[im 3/5]
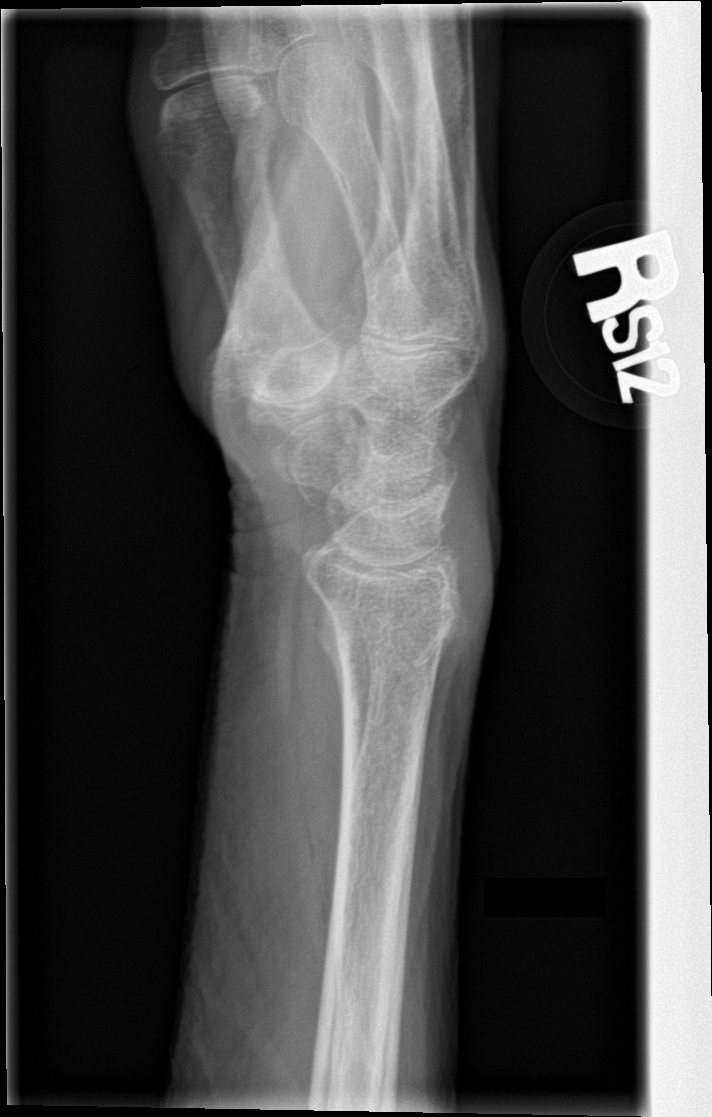
[im 4/5]
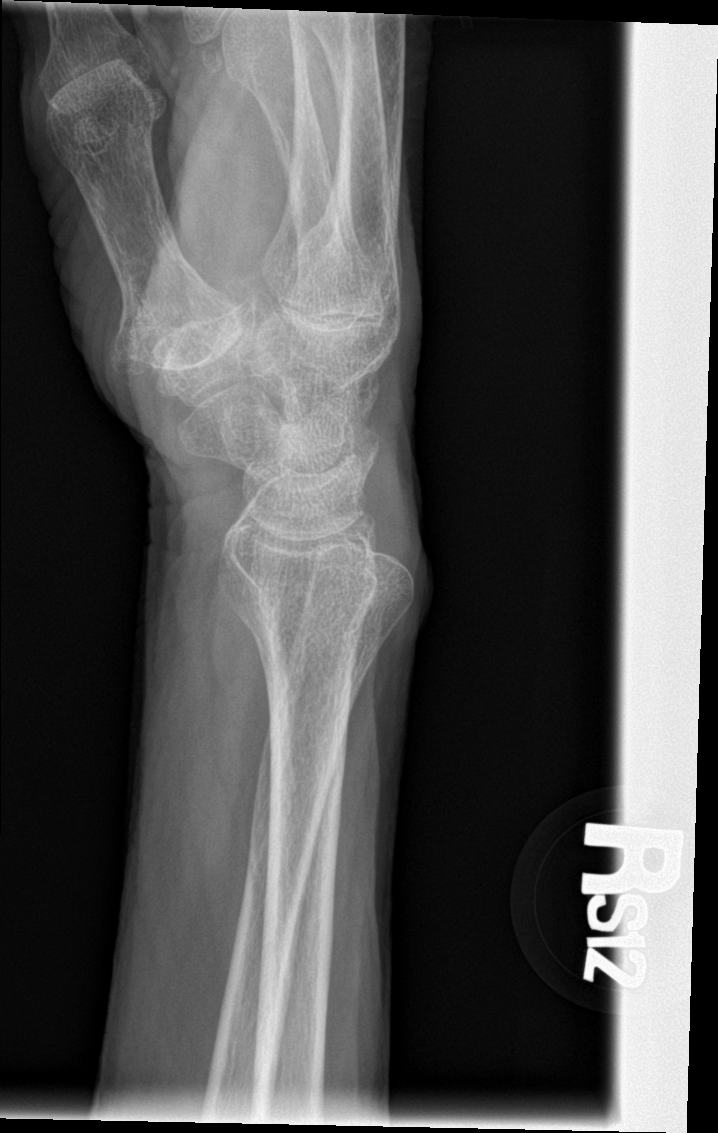
[im 5/5]
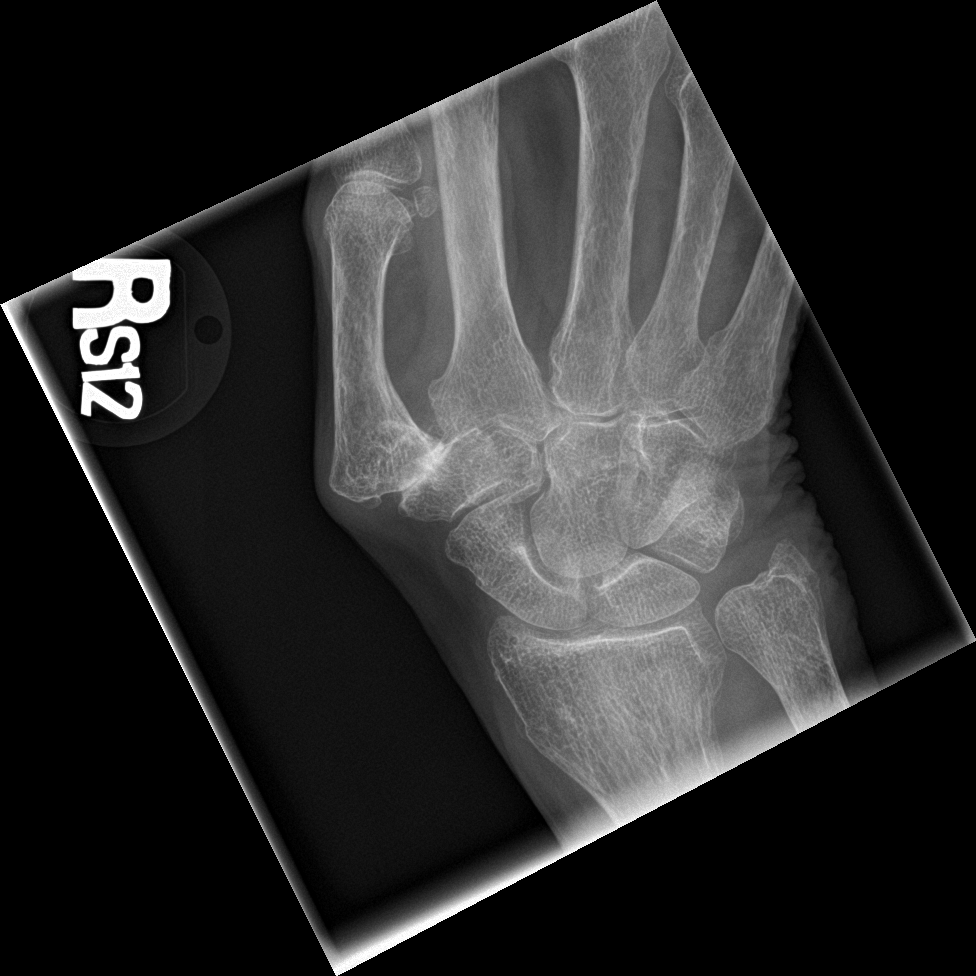

[5 of 5 positions shown; findings below may reference images not displayed]

FINDINGS: No acute bony abnormality. Specifically, no fracture, subluxation,
or dislocation. Soft tissues are intact. Degenerative changes at the
first carpometacarpal joint.
IMPRESSION: No acute bony abnormality.

## 2018-04-18 IMAGING — CT CT CERVICAL SPINE W/O CM
4 of 7 series · 12 of 33 positions shown, 13 images · non-contrast
Comparison: 02/15/2016

CLINICAL DATA: Recent fall this morning

EXAM:
CT HEAD WITHOUT CONTRAST
CT CERVICAL SPINE WITHOUT CONTRAST
TECHNIQUE: Multidetector CT imaging of the head and cervical spine was
performed following the standard protocol without intravenous
contrast. Multiplanar CT image reconstructions of the cervical spine
were also generated.

[Series 7: c spine soft · axial · 0.50mm/px · z∈[-201,-177]mm · 2 of 59 slices shown]
[im 12/59  soft-tissue]
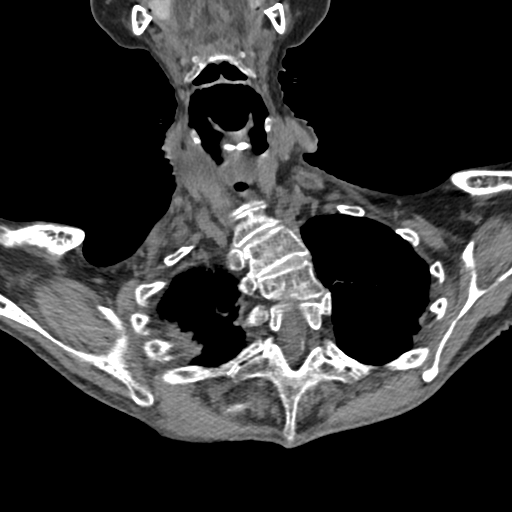
[im 24/59  soft-tissue]
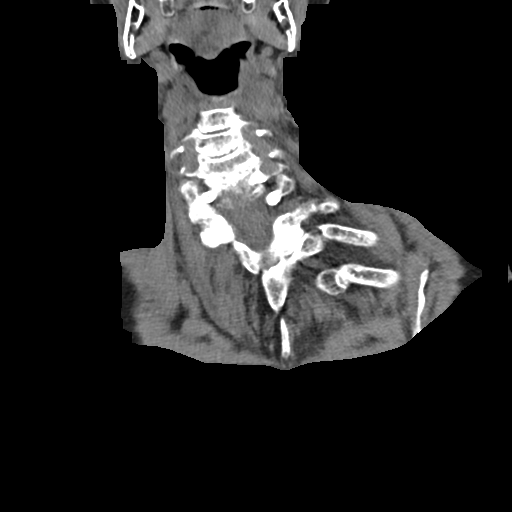

[Series 10: sagittal bone · sagittal · 0.23mm/px · 5 of 52 slices shown]
[im 9/52  bone]
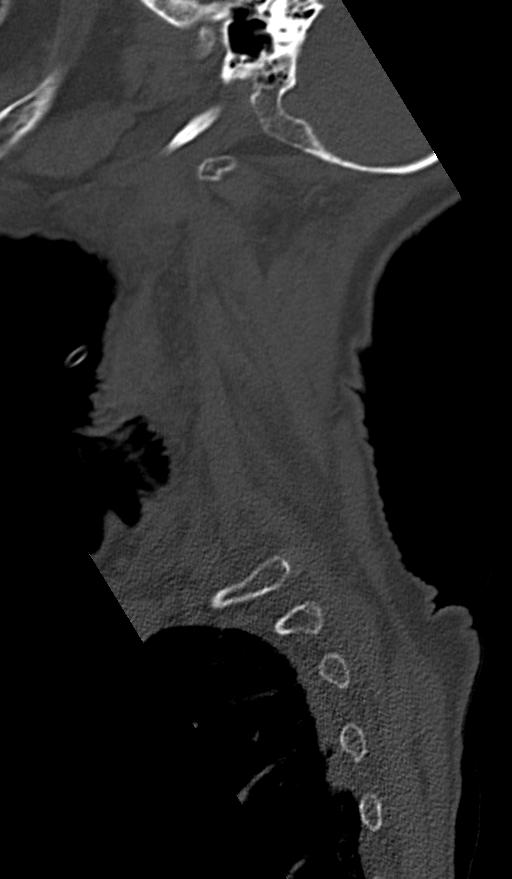
[im 18/52  bone]
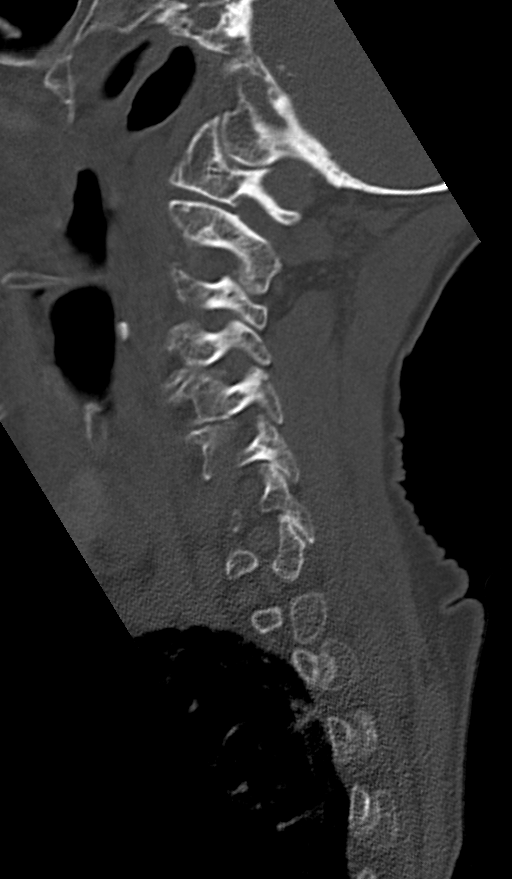
[im 26/52  bone]
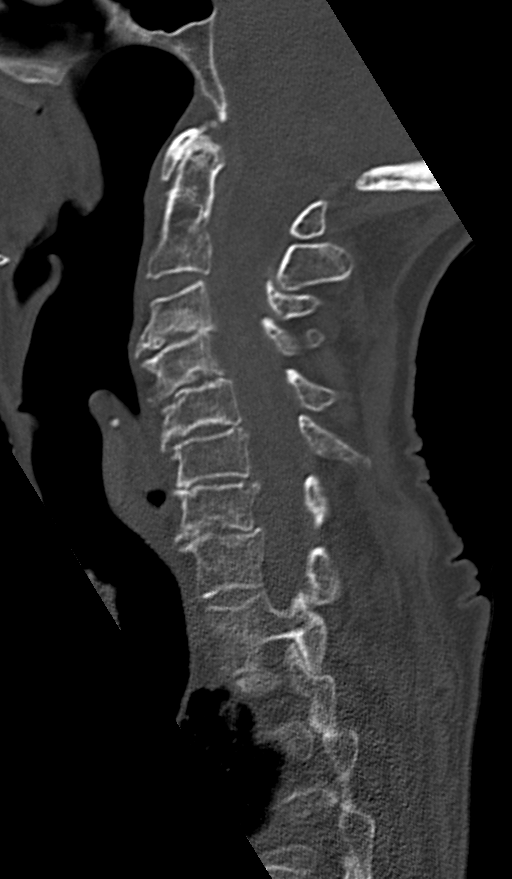
[im 35/52  bone]
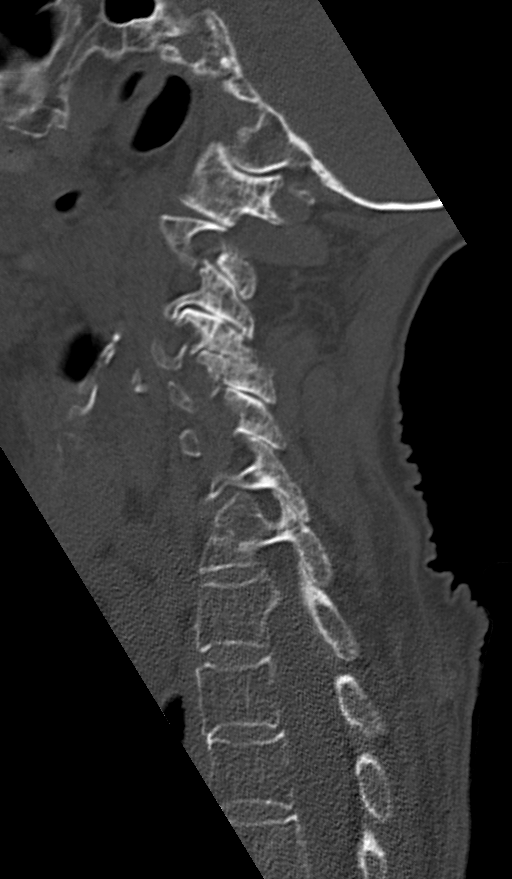
[im 43/52  bone]
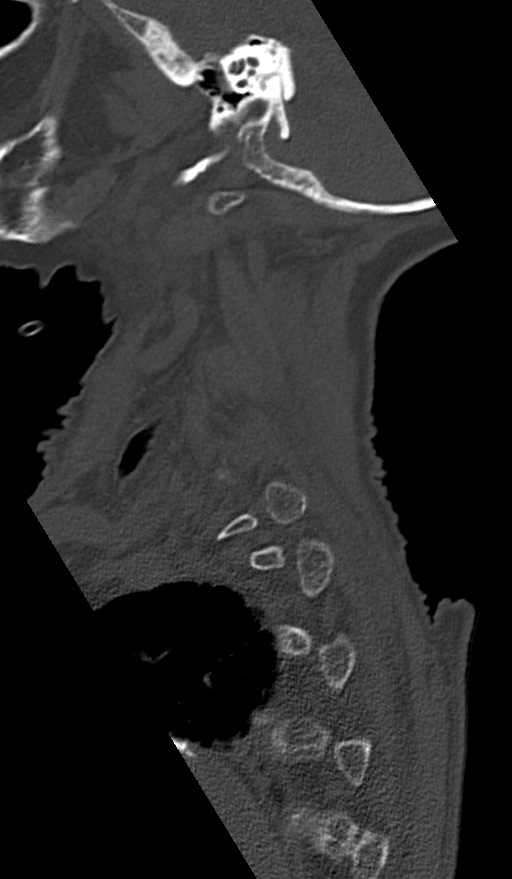

[Series 11: coronal bone · axial · 0.27mm/px · z∈[-135,-92]mm · 3 of 53 slices shown, 4 images]
[im 14/53  soft-tissue]
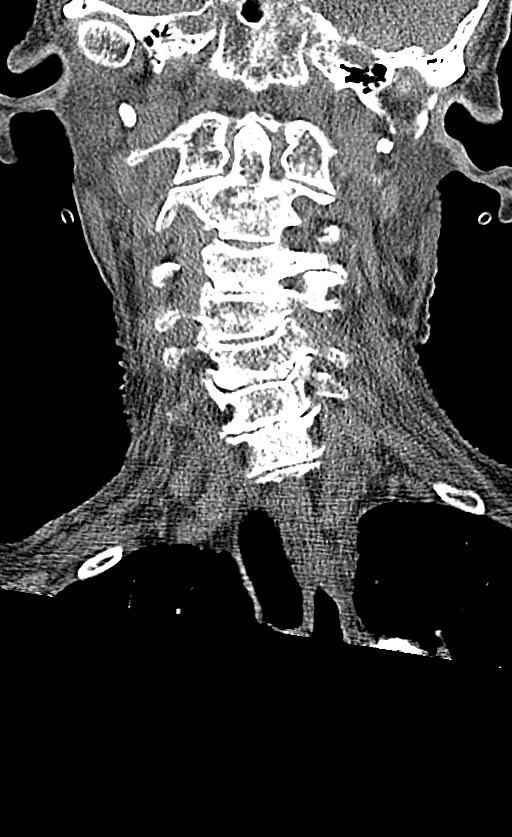
[im 14/53  bone]
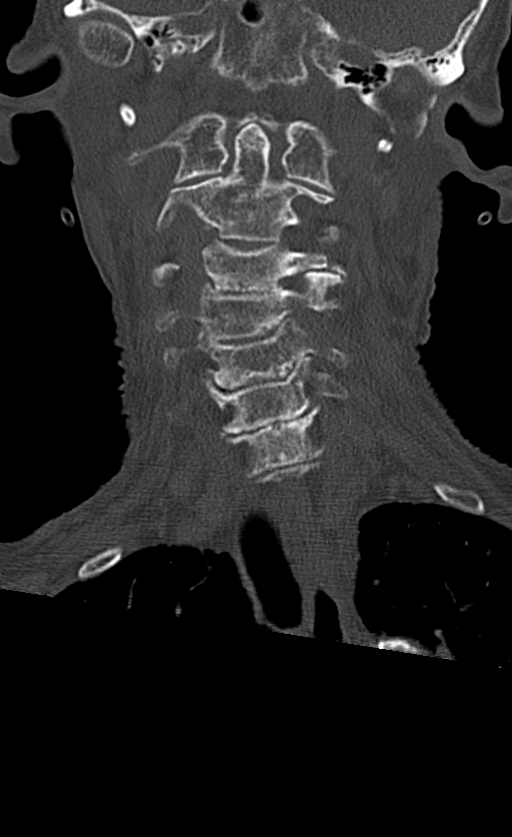
[im 27/53  bone]
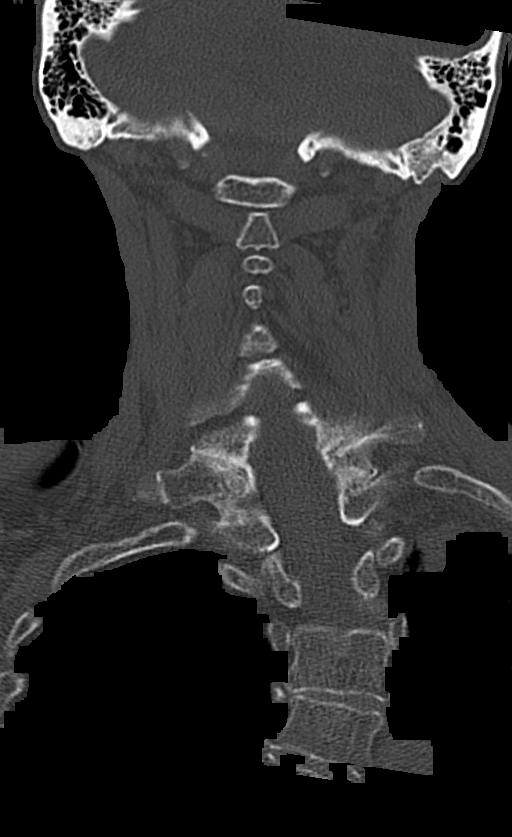
[im 40/53  bone]
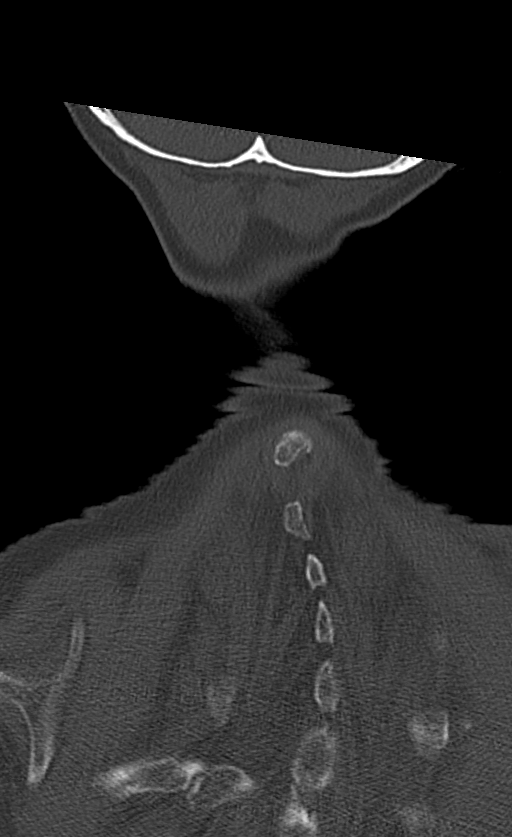

[Series 12: orthogonal bone · coronal · 0.23mm/px · 2 of 96 slices shown]
[im 32/96  bone]
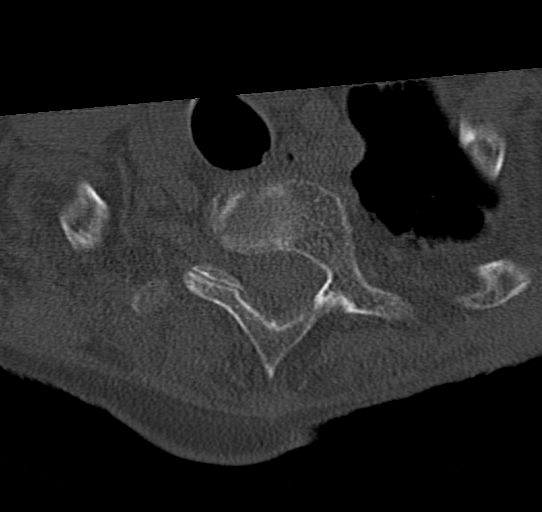
[im 64/96  bone]
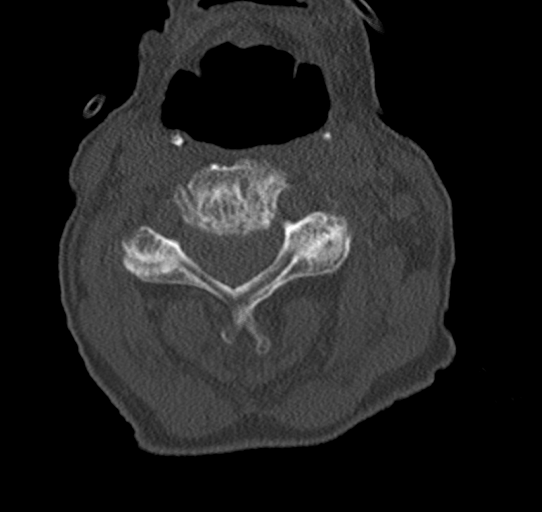

[12 of 33 positions shown; findings below may reference images not displayed]

FINDINGS: CT HEAD FINDINGS

Brain: Diffuse atrophic changes and chronic white matter ischemic
change is noted. No findings to suggest acute hemorrhage, acute
infarction or space-occupying mass lesion are noted.

Vascular: No hyperdense vessel or unexpected calcification.

Skull: Postsurgical changes are noted in the right frontoparietal
region.

Sinuses/Orbits: No acute finding.

Other: None.

CT CERVICAL SPINE FINDINGS

Alignment: Mild anterolisthesis of C4 on C5 is noted of a
degenerative nature.

Skull base and vertebrae: 7 cervical segments are well visualized.
Vertebral body height is well maintained. Osteophytic changes are
noted at all levels from C4-C7. Facet hypertrophic changes are noted
at multiple levels. No acute fracture or acute facet abnormality is
noted. Scoliosis of the cervicothoracic spine is noted.

Soft tissues and spinal canal: No prevertebral fluid or swelling. No
visible canal hematoma.

Disc levels:  Multilevel disc space narrowing is noted from C3-T1.

Upper chest: Biapical scarring is noted.
IMPRESSION: CT of the head: Atrophic and ischemic changes as well as
postoperative changes on the right. No acute abnormality noted.

CT of the cervical spine: Multilevel degenerative change without
acute abnormality.

## 2020-12-21 ENCOUNTER — Encounter: Payer: Self-pay | Admitting: Internal Medicine
